# Patient Record
Sex: Female | Born: 1980 | Race: Black or African American | Hispanic: No | Marital: Married | Smoking: Never smoker
Health system: Southern US, Community
[De-identification: ages and names within clinical notes are randomized; demographics above are authoritative.]

## PROBLEM LIST (undated history)

## (undated) DIAGNOSIS — F909 Attention-deficit hyperactivity disorder, unspecified type: Secondary | ICD-10-CM

## (undated) DIAGNOSIS — Z9289 Personal history of other medical treatment: Secondary | ICD-10-CM

## (undated) DIAGNOSIS — F419 Anxiety disorder, unspecified: Secondary | ICD-10-CM

## (undated) DIAGNOSIS — N809 Endometriosis, unspecified: Secondary | ICD-10-CM

## (undated) DIAGNOSIS — D649 Anemia, unspecified: Secondary | ICD-10-CM

## (undated) DIAGNOSIS — K219 Gastro-esophageal reflux disease without esophagitis: Secondary | ICD-10-CM

## (undated) DIAGNOSIS — J45909 Unspecified asthma, uncomplicated: Secondary | ICD-10-CM

## (undated) HISTORY — DX: Attention-deficit hyperactivity disorder, unspecified type: F90.9

## (undated) HISTORY — PX: DILATION AND CURETTAGE OF UTERUS: SHX78

## (undated) HISTORY — PX: NASAL SINUS SURGERY: SHX719

## (undated) HISTORY — PX: DIAGNOSTIC LAPAROSCOPY: SUR761

## (undated) HISTORY — PX: TUBAL LIGATION: SHX77

## (undated) HISTORY — PX: OTHER SURGICAL HISTORY: SHX169

## (undated) HISTORY — DX: Anxiety disorder, unspecified: F41.9

---
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
## 1999-01-19 ENCOUNTER — Other Ambulatory Visit: Admission: RE | Admit: 1999-01-19 | Discharge: 1999-01-19 | Payer: Self-pay | Admitting: *Deleted

## 1999-04-25 ENCOUNTER — Inpatient Hospital Stay (HOSPITAL_COMMUNITY): Admission: AD | Admit: 1999-04-25 | Discharge: 1999-04-25 | Payer: Self-pay | Admitting: Obstetrics and Gynecology

## 1999-12-21 ENCOUNTER — Other Ambulatory Visit: Admission: RE | Admit: 1999-12-21 | Discharge: 1999-12-21 | Payer: Self-pay | Admitting: Internal Medicine

## 2001-04-12 ENCOUNTER — Emergency Department (HOSPITAL_COMMUNITY): Admission: EM | Admit: 2001-04-12 | Discharge: 2001-04-12 | Payer: Self-pay | Admitting: Emergency Medicine

## 2001-09-20 ENCOUNTER — Inpatient Hospital Stay (HOSPITAL_COMMUNITY): Admission: AD | Admit: 2001-09-20 | Discharge: 2001-09-20 | Payer: Self-pay | Admitting: Obstetrics

## 2001-11-23 ENCOUNTER — Other Ambulatory Visit: Admission: RE | Admit: 2001-11-23 | Discharge: 2001-11-23 | Payer: Self-pay | Admitting: Obstetrics and Gynecology

## 2004-12-06 DIAGNOSIS — Z9289 Personal history of other medical treatment: Secondary | ICD-10-CM

## 2004-12-06 HISTORY — DX: Personal history of other medical treatment: Z92.89

## 2005-06-07 ENCOUNTER — Encounter (INDEPENDENT_AMBULATORY_CARE_PROVIDER_SITE_OTHER): Payer: Self-pay | Admitting: *Deleted

## 2005-06-07 ENCOUNTER — Observation Stay (HOSPITAL_COMMUNITY): Admission: AD | Admit: 2005-06-07 | Discharge: 2005-06-08 | Payer: Self-pay | Admitting: Gynecology

## 2005-06-07 ENCOUNTER — Encounter: Payer: Self-pay | Admitting: Emergency Medicine

## 2009-01-02 ENCOUNTER — Emergency Department (HOSPITAL_COMMUNITY): Admission: EM | Admit: 2009-01-02 | Discharge: 2009-01-03 | Payer: Self-pay | Admitting: Emergency Medicine

## 2010-05-15 ENCOUNTER — Ambulatory Visit (HOSPITAL_COMMUNITY): Admission: RE | Admit: 2010-05-15 | Discharge: 2010-05-15 | Payer: Self-pay | Admitting: Obstetrics

## 2011-02-22 LAB — CBC
Hemoglobin: 13.2 g/dL (ref 12.0–15.0)
MCHC: 34.4 g/dL (ref 30.0–36.0)
MCV: 93.3 fL (ref 78.0–100.0)
RBC: 4.11 MIL/uL (ref 3.87–5.11)

## 2011-02-22 LAB — PREGNANCY, URINE: Preg Test, Ur: NEGATIVE

## 2011-03-22 LAB — DIFFERENTIAL
Basophils Absolute: 0.1 10*3/uL (ref 0.0–0.1)
Basophils Relative: 1 % (ref 0–1)
Monocytes Absolute: 0.6 10*3/uL (ref 0.1–1.0)
Neutro Abs: 2.4 10*3/uL (ref 1.7–7.7)
Neutrophils Relative %: 40 % — ABNORMAL LOW (ref 43–77)

## 2011-03-22 LAB — URINALYSIS, ROUTINE W REFLEX MICROSCOPIC
Bilirubin Urine: NEGATIVE
Ketones, ur: NEGATIVE mg/dL
Nitrite: NEGATIVE
Protein, ur: NEGATIVE mg/dL
Urobilinogen, UA: 0.2 mg/dL (ref 0.0–1.0)

## 2011-03-22 LAB — CBC
Hemoglobin: 12.5 g/dL (ref 12.0–15.0)
MCHC: 33.2 g/dL (ref 30.0–36.0)
Platelets: 323 10*3/uL (ref 150–400)
RDW: 12.9 % (ref 11.5–15.5)

## 2011-03-22 LAB — POCT I-STAT, CHEM 8
Calcium, Ion: 1.2 mmol/L (ref 1.12–1.32)
Chloride: 105 mEq/L (ref 96–112)
HCT: 42 % (ref 36.0–46.0)
Sodium: 141 mEq/L (ref 135–145)
TCO2: 25 mmol/L (ref 0–100)

## 2011-03-22 LAB — WET PREP, GENITAL
Trich, Wet Prep: NONE SEEN
Yeast Wet Prep HPF POC: NONE SEEN

## 2011-03-22 LAB — URINE MICROSCOPIC-ADD ON

## 2011-03-22 LAB — RPR: RPR Ser Ql: NONREACTIVE

## 2011-04-23 NOTE — Discharge Summary (Signed)
NAME:  Andrea Waters, Andrea Waters NO.:  [id number]   MEDICAL RECORD NO.:  [id number]          PATIENT TYPE:  INP   LOCATION:  9319                          FACILITY:  [facility]   PHYSICIAN:  Ginger Carne, MD  DATE OF BIRTH:  October 21, 1981   DATE OF ADMISSION:  06/07/2005  DATE OF DISCHARGE:  06/08/2005                                 DISCHARGE SUMMARY   REASON FOR HOSPITALIZATION:  Right ectopic pregnancy, 5-[redacted] week gestation.   IN HOSPITAL PROCEDURES:  Laparoscopic right salpingectomy.   FINAL DIAGNOSIS:  Right ectopic tubal pregnancy.   HOSPITAL COURSE:  This patient is a 30 year old African-American female who  presented to the [facility] emergency room, in the early morning of June 07, 2005, complaining of right lower quadrant pain. Her last menstrual period  was Apr 21, 2005 and a transvaginal ultrasound revealed evidence of right  ectopic pregnancy. She has had 2 weeks of right lower quadrant pain  preceding said admission. Her hemoglobin at the time of being seen in the  emergency room was 8.7 which went to 7.9 on the morning of said admission to  the [facility] [place].   The patient was taken to the operating room in the late afternoon of June 07, 2005 and received 2 units of packed red blood cells at the same time. The  patient underwent a laparoscopic right salpingectomy. The tube was  completely involved with said ectopic pregnancy and could not be safely  preserved. Her postoperative course was uneventful.  She was afebrile. Her postoperative hemoglobin was 10.5, and hematocrit  33.2. She postoperatively had a soft abdomen, calves were without  tenderness. An intrauterine device was seen inutero at the time of her  preoperative ultrasound.   The patient was given routine postoperative instructions, advised to call  the office for temperature elevation of 100.4 degrees Fahrenheit, increasing  abdominal pain, and difficulty voiding. The patient is in  [employer] and  will be leaving for [place] in approximately 2 to 3 weeks and [place] care.   Vicodin 1-2 q.4-6h p.o. p.r.n. pain was prescribed to the patient. She was  kept overnight post-operatively because of significant nausea and vomiting  and difficulty in maintaining solids and liquids. She did respond to Zofran  and was stable on discharge on June 08, 2005 in the early a.m. The patient is  O positive.       SHB/MEDQ  D:  06/08/2005  T:  06/08/2005  Job:  [id number]

## 2011-04-23 NOTE — Op Note (Signed)
NAME:  [name], [name] NO.:  [id number]   MEDICAL RECORD NO.:  [id number]          PATIENT TYPE:  INP   LOCATION:  9319                          [facility]ILITY[facility]  [facility]   PHYSICIAN:  Ginger Carne, MD  DATE OF BIRTH:  21-Mar-1981   DATE OF PROCEDURE:  06/07/2005  DATE OF DISCHARGE:                                 OPERATIVE REPORT   PREOPERATIVE DIAGNOSIS:  Right ectopic pregnancy.   POSTOPERATIVE DIAGNOSIS:  Right ectopic pregnancy.   PROCEDURE:  Laparoscopic right salpingectomy.   SURGEON:  Ginger Carne, M.D.   ASSISTANT:  None.   COMPLICATIONS:  None immediate.   ESTIMATED BLOOD LOSS:  Minimal.   ANESTHESIA:  General.   SPECIMENS:  Right tube.   FINDINGS:  External genitalia, vulva, and vagina normal.  Cervix smooth  without erosions or lesions.  Laparoscopic evaluation revealed evidence of  approximately 50 mL of free blood in the cul-de-sac.  The right tube had a  large ectopic pregnancy starting from the isthmus to the fimbriated end with  leakage.  The right ovary was normal.  Left tube and ovary appeared normal.  No evidence of endometriosis or other pathology noted.  Upper abdomen  appeared normal.   DESCRIPTION OF PROCEDURE:  The patient was prepped and draped in the usual  sterile fashion and placed in the lithotomy position.  Betadine solution  used for antiseptic and the patient was catheterized prior to the procedure.  After adequate general anesthesia a tenaculum was placed on the anterior lip  of the cervix and the pilosa uterine manipulator placed inside the  endocervical canal.  Following this a vertical infraumbilical incision was  made and the Veress needle placed in the abdomen.  Opening and closing  pressures were 10 to 15 mmHg.  Needle released. Trocar placed in the same  incision.  Laparoscope placed through the trocar sleeve.  Afterward a 5 mm  port was made under direct visualization in the left lower quadrant in left  hypogastric region.  Inspection of the pelvic contents was carried out.  Afterward using bipolar cautery, the mesosalpinx on the right side was  bipolar cauterized to the isthmus.  The tube was then removed.  The isthmus  was further cauterized on the proximal end.  The tube was removed for  pathology with an Endobag.  Copious irrigation with Ringer's lactate  followed in the operative field  without evidence of active bleeding.  Irrigant removed.  Gas released and  trocars removed.  Closure of 10 mm fascia site with 0 Vicryl suture and 4-0  Vicryl for subcutaneous closure.  Needle, sponge, and instrument counts  correct.  The patient tolerated the procedure well and returned to the  postanesthesia recovery room in excellent condition.       SHB/MEDQ  D:  06/07/2005  T:  06/07/2005  Job:  [id number]

## 2013-02-20 ENCOUNTER — Encounter: Payer: Self-pay | Admitting: Obstetrics

## 2013-02-20 ENCOUNTER — Other Ambulatory Visit: Payer: Self-pay | Admitting: Obstetrics

## 2013-02-20 NOTE — H&P (Signed)
Andrea Waters is an 32 y.o. female. C/O of recurrent heavy periods and pain after having endometrial ablation done in 2011.  Desires repeat of procedure.  Pertinent Gynecological History: Menses: flow is moderate and with severe dysmenorrhea Bleeding: dysfunctional uterine bleeding Contraception: tubal ligation DES exposure: denies Blood transfusions: none Sexually transmitted diseases: no past history Previous GYN Procedures: IUD Insertion and Removal, Salpingectomy and BTL.  Last mammogram: n/a Date: n/a Last pap: normal Date: 2013 OB History: G5, P3   Menstrual History: Menarche age: unknown  No LMP recorded.    No past medical history on file.  No past surgical history on file.  No family history on file.  Social History:  has no tobacco, alcohol, and drug history on file.  Allergies: Not on File   (Not in a hospital admission)  Review of Systems  All other systems reviewed and are negative.    There were no vitals taken for this visit. Physical Exam  Nursing note and vitals reviewed. Constitutional: She is oriented to person, place, and time. She appears well-developed and well-nourished.  HENT:  Head: Normocephalic and atraumatic.  Eyes: Conjunctivae are normal. Pupils are equal, round, and reactive to light.  Neck: Normal range of motion. Neck supple.  Cardiovascular: Normal rate and regular rhythm.   Respiratory: Effort normal and breath sounds normal.  GI: Soft. Bowel sounds are normal.  Genitourinary: Vagina normal and uterus normal.  Musculoskeletal: Normal range of motion.  Neurological: She is alert and oriented to person, place, and time.  Skin: Skin is warm and dry.  Psychiatric: She has a normal mood and affect. Her behavior is normal. Judgment and thought content normal.    No results found for this or any previous visit (from the past 24 hour(s)).  No results found.  Assessment/Plan: Menorrhagia / Dysmenorrhea.  Hysteroscopy, D&C  and Endometrial Ablation planned.  Alexsys Eskin A 02/20/2013, 5:38 PM

## 2013-02-21 ENCOUNTER — Encounter (HOSPITAL_COMMUNITY): Payer: Self-pay | Admitting: Pharmacist

## 2013-02-21 ENCOUNTER — Encounter (HOSPITAL_COMMUNITY): Payer: Self-pay | Admitting: Obstetrics

## 2013-03-01 ENCOUNTER — Encounter (HOSPITAL_COMMUNITY): Payer: Self-pay | Admitting: Anesthesiology

## 2013-03-01 NOTE — Anesthesia Preprocedure Evaluation (Addendum)
Anesthesia Evaluation  Patient identified by MRN, date of birth, ID band Patient awake    Reviewed: Allergy & Precautions, H&P , NPO status , Patient's Chart, lab work & pertinent test results  History of Anesthesia Complications (+) PONV  Airway Mallampati: II TM Distance: >3 FB Neck ROM: Full    Dental no notable dental hx. (+) Teeth Intact   Pulmonary neg pulmonary ROS,  breath sounds clear to auscultation  Pulmonary exam normal       Cardiovascular negative cardio ROS  Rhythm:Regular Rate:Normal     Neuro/Psych negative neurological ROS  negative psych ROS   GI/Hepatic negative GI ROS, Neg liver ROS,   Endo/Other  negative endocrine ROS  Renal/GU negative Renal ROS  negative genitourinary   Musculoskeletal   Abdominal   Peds  Hematology negative hematology ROS (+)   Anesthesia Other Findings   Reproductive/Obstetrics Menorrhagia                          Anesthesia Physical Anesthesia Plan  ASA: II  Anesthesia Plan: General   Post-op Pain Management:    Induction:   Airway Management Planned: LMA  Additional Equipment:   Intra-op Plan:   Post-operative Plan: Extubation in OR  Informed Consent: I have reviewed the patients History and Physical, chart, labs and discussed the procedure including the risks, benefits and alternatives for the proposed anesthesia with the patient or authorized representative who has indicated his/her understanding and acceptance.   Dental advisory given  Plan Discussed with: CRNA, Anesthesiologist and Surgeon  Anesthesia Plan Comments:         Anesthesia Quick Evaluation

## 2013-03-02 ENCOUNTER — Encounter (HOSPITAL_COMMUNITY)
Admission: RE | Disposition: A | Discharge status: Home / Self Care | Payer: Self-pay | Source: Ambulatory Visit | Attending: Obstetrics

## 2013-03-02 ENCOUNTER — Ambulatory Visit (HOSPITAL_COMMUNITY)
Admission: RE | Admit: 2013-03-02 | Discharge: 2013-03-02 | Disposition: A | Discharge status: Home / Self Care | Source: Ambulatory Visit | Attending: Obstetrics | Admitting: Obstetrics

## 2013-03-02 ENCOUNTER — Encounter (HOSPITAL_COMMUNITY): Payer: Self-pay | Admitting: Anesthesiology

## 2013-03-02 ENCOUNTER — Encounter (HOSPITAL_COMMUNITY): Payer: Self-pay | Admitting: *Deleted

## 2013-03-02 ENCOUNTER — Ambulatory Visit (HOSPITAL_COMMUNITY): Admitting: Anesthesiology

## 2013-03-02 DIAGNOSIS — N920 Excessive and frequent menstruation with regular cycle: Secondary | ICD-10-CM

## 2013-03-02 DIAGNOSIS — N946 Dysmenorrhea, unspecified: Secondary | ICD-10-CM | POA: Insufficient documentation

## 2013-03-02 DIAGNOSIS — N949 Unspecified condition associated with female genital organs and menstrual cycle: Secondary | ICD-10-CM | POA: Insufficient documentation

## 2013-03-02 DIAGNOSIS — N938 Other specified abnormal uterine and vaginal bleeding: Secondary | ICD-10-CM | POA: Insufficient documentation

## 2013-03-02 HISTORY — DX: Personal history of other medical treatment: Z92.89

## 2013-03-02 HISTORY — PX: HYSTEROSCOPY: SHX211

## 2013-03-02 LAB — CBC
Hemoglobin: 12.2 g/dL (ref 12.0–15.0)
MCH: 31.2 pg (ref 26.0–34.0)
MCHC: 33.5 g/dL (ref 30.0–36.0)
Platelets: 279 10*3/uL (ref 150–400)
RDW: 12.2 % (ref 11.5–15.5)

## 2013-03-02 SURGERY — HYSTEROSCOPY
Anesthesia: General | Site: Vagina | Wound class: Clean Contaminated

## 2013-03-02 MED ORDER — FENTANYL CITRATE 0.05 MG/ML IJ SOLN
INTRAMUSCULAR | Status: AC
  Administered 2013-03-02: 50 ug via INTRAVENOUS
  Filled 2013-03-02: qty 2

## 2013-03-02 MED ORDER — DEXAMETHASONE SODIUM PHOSPHATE 10 MG/ML IJ SOLN
INTRAMUSCULAR | Status: AC
  Filled 2013-03-02: qty 1

## 2013-03-02 MED ORDER — MIDAZOLAM HCL 5 MG/5ML IJ SOLN
INTRAMUSCULAR | Status: DC | PRN
  Administered 2013-03-02 (×2): 1 mg via INTRAVENOUS

## 2013-03-02 MED ORDER — SCOPOLAMINE 1 MG/3DAYS TD PT72
MEDICATED_PATCH | TRANSDERMAL | Status: AC
  Administered 2013-03-02: 1.5 mg via TRANSDERMAL
  Filled 2013-03-02: qty 1

## 2013-03-02 MED ORDER — DEXTROSE 5 % IV SOLN
INTRAVENOUS | Status: DC | PRN
  Administered 2013-03-02: 1000 mL via INTRAVENOUS

## 2013-03-02 MED ORDER — PROPOFOL 10 MG/ML IV EMUL
INTRAVENOUS | Status: DC | PRN
  Administered 2013-03-02: 150 mg via INTRAVENOUS

## 2013-03-02 MED ORDER — MIDAZOLAM HCL 2 MG/2ML IJ SOLN
INTRAMUSCULAR | Status: AC
  Filled 2013-03-02: qty 2

## 2013-03-02 MED ORDER — FENTANYL CITRATE 0.05 MG/ML IJ SOLN
INTRAMUSCULAR | Status: DC | PRN
  Administered 2013-03-02: 50 ug via INTRAVENOUS

## 2013-03-02 MED ORDER — FENTANYL CITRATE 0.05 MG/ML IJ SOLN
25.0000 ug | INTRAMUSCULAR | Status: DC | PRN
  Administered 2013-03-02 (×2): 50 ug via INTRAVENOUS

## 2013-03-02 MED ORDER — ONDANSETRON HCL 4 MG/2ML IJ SOLN
INTRAMUSCULAR | Status: DC | PRN
  Administered 2013-03-02: 4 mg via INTRAVENOUS

## 2013-03-02 MED ORDER — PROPOFOL 10 MG/ML IV EMUL
INTRAVENOUS | Status: AC
  Filled 2013-03-02: qty 20

## 2013-03-02 MED ORDER — GLYCOPYRROLATE 0.2 MG/ML IJ SOLN
INTRAMUSCULAR | Status: AC
  Filled 2013-03-02: qty 2

## 2013-03-02 MED ORDER — LACTATED RINGERS IV SOLN
INTRAVENOUS | Status: DC
  Administered 2013-03-02 (×2): via INTRAVENOUS

## 2013-03-02 MED ORDER — DEXAMETHASONE SODIUM PHOSPHATE 4 MG/ML IJ SOLN
INTRAMUSCULAR | Status: DC | PRN
  Administered 2013-03-02: 4 mg via INTRAVENOUS

## 2013-03-02 MED ORDER — OXYCODONE-ACETAMINOPHEN 10-325 MG PO TABS: 1.0000 | ORAL_TABLET | Freq: Four times a day (QID) | ORAL | Status: DC | PRN

## 2013-03-02 MED ORDER — SCOPOLAMINE 1 MG/3DAYS TD PT72: 1.0000 | MEDICATED_PATCH | TRANSDERMAL | Status: DC

## 2013-03-02 MED ORDER — GLYCOPYRROLATE 0.2 MG/ML IJ SOLN
INTRAMUSCULAR | Status: AC
  Filled 2013-03-02: qty 1

## 2013-03-02 MED ORDER — KETOROLAC TROMETHAMINE 30 MG/ML IJ SOLN
INTRAMUSCULAR | Status: AC
  Filled 2013-03-02: qty 1

## 2013-03-02 MED ORDER — KETOROLAC TROMETHAMINE 30 MG/ML IJ SOLN
INTRAMUSCULAR | Status: DC | PRN
  Administered 2013-03-02: 30 mg via INTRAMUSCULAR

## 2013-03-02 MED ORDER — LIDOCAINE HCL 1 % IJ SOLN
INTRAMUSCULAR | Status: DC | PRN
  Administered 2013-03-02: 20 mL

## 2013-03-02 MED ORDER — METOCLOPRAMIDE HCL 5 MG/ML IJ SOLN: 10.0000 mg | Freq: Once | INTRAMUSCULAR | Status: DC | PRN

## 2013-03-02 MED ORDER — FENTANYL CITRATE 0.05 MG/ML IJ SOLN
INTRAMUSCULAR | Status: AC
  Filled 2013-03-02: qty 4

## 2013-03-02 MED ORDER — LIDOCAINE HCL (CARDIAC) 20 MG/ML IV SOLN
INTRAVENOUS | Status: DC | PRN
  Administered 2013-03-02: 60 mg via INTRAVENOUS

## 2013-03-02 MED ORDER — ONDANSETRON HCL 4 MG/2ML IJ SOLN
INTRAMUSCULAR | Status: AC
  Filled 2013-03-02: qty 2

## 2013-03-02 MED ORDER — LIDOCAINE HCL (CARDIAC) 20 MG/ML IV SOLN
INTRAVENOUS | Status: AC
  Filled 2013-03-02: qty 5

## 2013-03-02 MED ORDER — NEOSTIGMINE METHYLSULFATE 1 MG/ML IJ SOLN
INTRAMUSCULAR | Status: AC
  Filled 2013-03-02: qty 1

## 2013-03-02 MED ORDER — MEPERIDINE HCL 25 MG/ML IJ SOLN: 6.2500 mg | INTRAMUSCULAR | Status: DC | PRN

## 2013-03-02 SURGICAL SUPPLY — 14 items
CANISTER SUCTION 2500CC (MISCELLANEOUS) ×3 IMPLANT
CATH ROBINSON RED A/P 16FR (CATHETERS) ×3 IMPLANT
CATH THERMACHOICE III (CATHETERS) ×3 IMPLANT
CLOTH BEACON ORANGE TIMEOUT ST (SAFETY) ×3 IMPLANT
CONTAINER PREFILL 10% NBF 60ML (FORM) IMPLANT
DILATOR CANAL MILEX (MISCELLANEOUS) ×3 IMPLANT
DRESSING TELFA 8X3 (GAUZE/BANDAGES/DRESSINGS) ×3 IMPLANT
GLOVE BIO SURGEON STRL SZ7.5 (GLOVE) ×3 IMPLANT
GOWN PREVENTION PLUS XLARGE (GOWN DISPOSABLE) ×3 IMPLANT
GOWN STRL REIN XL XLG (GOWN DISPOSABLE) ×6 IMPLANT
PACK HYSTEROSCOPY LF (CUSTOM PROCEDURE TRAY) ×3 IMPLANT
PAD OB MATERNITY 4.3X12.25 (PERSONAL CARE ITEMS) ×3 IMPLANT
TOWEL OR 17X24 6PK STRL BLUE (TOWEL DISPOSABLE) ×6 IMPLANT
WATER STERILE IRR 1000ML POUR (IV SOLUTION) ×3 IMPLANT

## 2013-03-02 NOTE — Preoperative (Signed)
Beta Blockers   Reason not to administer Beta Blockers:Not Applicable 

## 2013-03-02 NOTE — Op Note (Signed)
Preoperative diagnosis: dysfunctional uterine bleeding and dysmenorrhagia  Postoperative diagnosis: Same, endometrial cavity adhesions  Procedure: Diagnostic hysteroscopy  Surgeon: Coral Ceo  Anesthesia:general  Estimated blood loss: Negligible  Urine output: Not measured  IV Fluids: per anesthesia  Complications: None  Specimen: none  Operative Findings: Multiple endometrial cavity adhesions and narrowing at internal os impeding entrance into the cavity of the uterus.   Description of procedure:   The patient was taken to the operating room and placed on the operating table in the semi-lithotomy position in [place] stirrups.  Examination under anesthesia was performed.  The patient was prepped and draped in the usual manner.  After a time-out had been completed, a speculum was placed in the vagina.  The anterior lip of the cervix was grasped with a single-toothed tenaculum.    20 cc of 1% lidocaine were injected at 4 and 7 o'clock to produce a paracervical block.  The uterine cavity sounded to 5 cm.  The endometrial cavity was narrow and scarred and could not get access to fundus.  A 5 mm diagnostic hysteroscope with Glycine as the distending medium was used to perform a diagnostic hysteroscopy.  The findings are noted above.    The hysteroscope was removed.   All the instruments were removed from the vagina.  Final instrument counts were correct.  The patient was taken to the PACU in stable condition.

## 2013-03-02 NOTE — Anesthesia Postprocedure Evaluation (Signed)
  Anesthesia Post-op Note  Anesthesia Post Note  Patient: Andrea Waters  Procedure(s) Performed: Procedure(s) (LRB): HYSTEROSCOPY (N/A)  Anesthesia type: General  Patient location: PACU  Post pain: Pain level controlled  Post assessment: Post-op Vital signs reviewed  Last Vitals:  Filed Vitals:   03/02/13 1001  BP:   Pulse: 76  Temp: 36.9 C  Resp: 17    Post vital signs: Reviewed  Level of consciousness: sedated  Complications: No apparent anesthesia complications

## 2013-03-02 NOTE — H&P (Signed)
There has been no change in patient's status sine H&P done.

## 2013-03-02 NOTE — Transfer of Care (Signed)
Immediate Anesthesia Transfer of Care Note  Patient: Andrea Waters  Procedure(s) Performed: Procedure(s): DILATATION & CURETTAGE/HYSTEROSCOPY WITH THERMACHOICE ABLATION (N/A)  Patient Location: PACU  Anesthesia Type:General  Level of Consciousness: awake, alert , oriented and patient cooperative  Airway & Oxygen Therapy: Patient Spontanous Breathing and Patient connected to nasal cannula oxygen  Post-op Assessment: Report given to PACU RN and Post -op Vital signs reviewed and stable  Post vital signs: Reviewed and stable  Complications: No apparent anesthesia complications

## 2013-03-05 ENCOUNTER — Encounter (HOSPITAL_COMMUNITY): Payer: Self-pay | Admitting: Obstetrics

## 2013-03-22 ENCOUNTER — Encounter: Payer: Self-pay | Admitting: Obstetrics

## 2013-05-02 ENCOUNTER — Ambulatory Visit (INDEPENDENT_AMBULATORY_CARE_PROVIDER_SITE_OTHER): Admitting: Obstetrics

## 2013-05-02 ENCOUNTER — Encounter: Payer: Self-pay | Admitting: Obstetrics

## 2013-05-02 VITALS — BP 120/82 | HR 72 | Temp 98.5°F | Ht 63.0 in | Wt 138.8 lb

## 2013-05-02 DIAGNOSIS — N920 Excessive and frequent menstruation with regular cycle: Secondary | ICD-10-CM

## 2013-05-02 DIAGNOSIS — N946 Dysmenorrhea, unspecified: Secondary | ICD-10-CM

## 2013-05-02 NOTE — Progress Notes (Signed)
.   Subjective:     Andrea Waters is a 32 y.o. female who presents to the clinic 8 weeks status post diagnostic hysteroscopy for abnormal uterine bleeding and pelvic pain. Eating a regular diet without difficulty. Bowel movements are normal. The patient is not having any pain.  The following portions of the patient's history were reviewed and updated as appropriate: allergies, current medications, past family history, past medical history, past social history, past surgical history and problem list.  Review of Systems Pertinent items are noted in HPI.    Objective:    BP 120/82  Pulse 72  Temp(Src) 98.5 F (36.9 C) (Oral)  Ht 5\' 3"  (1.6 m)  Wt 138 lb 12.8 oz (62.959 kg)  BMI 24.59 kg/m2 General:  alert and no distress  Abdomen: soft, non-tender  Incision:    None     Assessment:    Doing well postoperatively. Operative findings again reviewed. Pathology report discussed.   Patient considering further surgical options, i.e. Hysterectomy. Plan:    1. Continue any current medications. 2. Wound care discussed. 3. Activity restrictions: none 4. Anticipated return to work: now. 5. Follow up: prn

## 2013-05-03 ENCOUNTER — Encounter: Payer: Self-pay | Admitting: Obstetrics

## 2013-05-03 NOTE — Patient Instructions (Signed)
Management of DUB and Dysmenorrhea.

## 2013-05-08 ENCOUNTER — Other Ambulatory Visit: Payer: Self-pay | Admitting: Obstetrics

## 2013-05-08 ENCOUNTER — Ambulatory Visit (INDEPENDENT_AMBULATORY_CARE_PROVIDER_SITE_OTHER): Admitting: Obstetrics

## 2013-05-08 ENCOUNTER — Ambulatory Visit (INDEPENDENT_AMBULATORY_CARE_PROVIDER_SITE_OTHER)

## 2013-05-08 ENCOUNTER — Encounter: Payer: Self-pay | Admitting: Obstetrics

## 2013-05-08 VITALS — BP 116/85 | HR 69 | Temp 97.9°F | Ht 63.0 in | Wt 139.0 lb

## 2013-05-08 DIAGNOSIS — N946 Dysmenorrhea, unspecified: Secondary | ICD-10-CM

## 2013-05-08 DIAGNOSIS — N949 Unspecified condition associated with female genital organs and menstrual cycle: Secondary | ICD-10-CM

## 2013-05-08 DIAGNOSIS — N809 Endometriosis, unspecified: Secondary | ICD-10-CM

## 2013-05-08 DIAGNOSIS — N920 Excessive and frequent menstruation with regular cycle: Secondary | ICD-10-CM | POA: Insufficient documentation

## 2013-05-08 MED ORDER — OXYCODONE-ACETAMINOPHEN 10-325 MG PO TABS: 1.0000 | ORAL_TABLET | ORAL | Status: DC | PRN

## 2013-05-08 NOTE — Progress Notes (Signed)
Subjective:    Andrea Waters is a 32 y.o. female who presents to the clinic s/p endometrial ablation for menorrhagia and dysmenorrhea.  Has had no bleeding since procedure but still has pain.  Eating a regular diet without difficulty. Bowel movements are normal. The patient has H/O endometriosis documented on laparoscopy when she had a tubal sterilization.  She presents for consultation for hysterectomy for the chronic pain because she is tired of taking pain meds, Lupron, etc chronically.    Gynecologic History Contraception: tubal ligation     The following portions of the patient's history were reviewed and updated as appropriate: allergies, current medications, past family history, past medical history, past social history, past surgical history and problem list.  Review of Systems Pertinent items are noted in HPI.    Objective:    General appearance: alert and no distress    Abdomen:  Soft, NT  Pelvic:  NEFG.               Vulva, vagina and cervix normal.               Uterus NSSC, NT.               Adnexa negative Assessment:   Chronic pelvic pain.  H/O endometriosis.  Pelvic U/S done in office and WNL's  Patient wants hysterectomy.   Plan:    Education reviewed: Management of chronic pelvic pain.Marland Kitchen    Referred for consultation with Dr. Tina Griffiths for possible Robotic Assisted TLH.

## 2013-05-09 ENCOUNTER — Encounter: Payer: Self-pay | Admitting: Obstetrics

## 2013-05-15 ENCOUNTER — Encounter: Payer: Self-pay | Admitting: Obstetrics

## 2013-05-28 ENCOUNTER — Encounter: Payer: Self-pay | Admitting: Obstetrics

## 2013-05-28 ENCOUNTER — Ambulatory Visit (INDEPENDENT_AMBULATORY_CARE_PROVIDER_SITE_OTHER): Admitting: Obstetrics

## 2013-05-28 ENCOUNTER — Encounter: Payer: Self-pay | Admitting: Obstetrics & Gynecology

## 2013-05-28 ENCOUNTER — Ambulatory Visit (INDEPENDENT_AMBULATORY_CARE_PROVIDER_SITE_OTHER): Admitting: Obstetrics & Gynecology

## 2013-05-28 VITALS — BP 138/84 | HR 71 | Temp 98.5°F | Ht 63.0 in | Wt 138.8 lb

## 2013-05-28 DIAGNOSIS — N809 Endometriosis, unspecified: Secondary | ICD-10-CM

## 2013-05-28 MED ORDER — OXYCODONE-ACETAMINOPHEN 10-325 MG PO TABS: 1.0000 | ORAL_TABLET | Freq: Four times a day (QID) | ORAL | Status: DC | PRN

## 2013-05-28 MED ORDER — NORETHINDRONE ACETATE 5 MG PO TABS: 5.0000 mg | ORAL_TABLET | Freq: Two times a day (BID) | ORAL | Status: DC

## 2013-05-28 NOTE — Patient Instructions (Addendum)
Hysterectomy Information  A hysterectomy is a procedure where your uterus is surgically removed. It will no longer be possible to have menstrual periods or to become pregnant. The tubes and ovaries can be removed (bilateral salpingo-oopherectomy) during this surgery as well.  REASONS FOR A HYSTERECTOMY  Persistent, abnormal bleeding.  Lasting (chronic) pelvic pain or infection.  The lining of the uterus (endometrium) starts growing outside the uterus (endometriosis).  The endometrium starts growing in the muscle of the uterus (adenomyosis).  The uterus falls down into the vagina (pelvic organ prolapse).  Symptomatic uterine fibroids.  Precancerous cells.  Cervical cancer or uterine cancer. TYPES OF HYSTERECTOMIES  Supracervical hysterectomy. This type removes the top part of the uterus, but not the cervix.  Total hysterectomy. This type removes the uterus and cervix.  Radical hysterectomy. This type removes the uterus, cervix, and the fibrous tissue that holds the uterus in place in the pelvis (parametrium). WAYS A HYSTERECTOMY CAN BE PERFORMED  Abdominal hysterectomy. A large surgical cut (incision) is made in the abdomen. The uterus is removed through this incision.  Vaginal hysterectomy. An incision is made in the vagina. The uterus is removed through this incision. There are no abdominal incisions.  Conventional laparoscopic hysterectomy. A thin, lighted tube with a camera (laparoscope) is inserted into 3 or 4 small incisions in the abdomen. The uterus is cut into small pieces. The small pieces are removed through the incisions, or they are removed through the vagina.  Laparoscopic assisted vaginal hysterectomy (LAVH). Three or four small incisions are made in the abdomen. Part of the surgery is performed laparoscopically and part vaginally. The uterus is removed through the vagina.  Robot-assisted laparoscopic hysterectomy. A laparoscope is inserted into 3 or 4 small  incisions in the abdomen. A computer-controlled device is used to give the surgeon a 3D image. This allows for more precise movements of surgical instruments. The uterus is cut into small pieces and removed through the incisions or removed through the vagina. RISKS OF HYSTERECTOMY   Bleeding and risk of blood transfusion. Tell your caregiver if you do not want to receive any blood products.  Blood clots in the legs or lung.  Infection.  Injury to surrounding organs.  Anesthesia problems or side effects.  Conversion to an abdominal hysterectomy. WHAT TO EXPECT AFTER A HYSTERECTOMY  You will be given pain medicine.  You will need to have someone with you for the first 3 to 5 days after you go home.  You will need to follow up with your surgeon in 2 to 4 weeks after surgery to evaluate your progress.  You may have early menopause symptoms like hot flashes, night sweats, and insomnia.  If you had a hysterectomy for a problem that was not a cancer or a condition that could lead to cancer, then you no longer need Pap tests. However, even if you no longer need a Pap test, a regular exam is a good idea to make sure no other problems are starting. Document Released: 05/18/2001 Document Revised: 02/14/2012 Document Reviewed: 07/03/2011 Exit[employer] Patient Information 2014 [employer].  

## 2013-05-28 NOTE — Progress Notes (Signed)
.   Subjective:     Andrea Waters is a 32 y.o. female here for a surgical consult for a hysterectomy (Endometrosis).  She had an ultrasound done on 05/08/13.  No current complaints.  Personal health questionnaire reviewed: yes.   Gynecologic History No LMP recorded. Patient has had an ablation. Contraception: none Last Pap: . Results were:  Last mammogram: N/A  Obstetric History OB History   Grav Para Term Preterm Abortions TAB SAB Ect Mult Living                   The following portions of the patient's history were reviewed and updated as appropriate: allergies, current medications, past family history, past medical history, past social history, past surgical history and problem list.  Review of Systems Pertinent items are noted in HPI.    Objective:    No exam performed today, patient left without being seen.    Assessment:    Cancelled Appt.   Plan:    Patient rescheduled.

## 2013-05-28 NOTE — Progress Notes (Signed)
.   Subjective:     Andrea Waters is a 32 y.o. female here for a surgical consult for a hysterectomy (endometerosis).  She had an ultrasound done on 05/08/2013.  No current complaints.  Personal health questionnaire reviewed: no.   Gynecologic History No LMP recorded. Patient has had an ablation. Contraception: none Last Pap: 10/2012. Results were: normal Last mammogram: N/A  Obstetric History OB History   Grav Para Term Preterm Abortions TAB SAB Ect Mult Living                   The following portions of the patient's history were reviewed and updated as appropriate: allergies, current medications, past family history, past medical history, past social history, past surgical history and problem list.  Review of Systems Pertinent items are noted in HPI.    Objective:     No exam today     Assessment:   Pelvic pain, h/o endometriosis   Plan:   Attempted TRH/LSO Risks, benefits, treatment alternatives reviewed

## 2013-05-30 ENCOUNTER — Institutional Professional Consult (permissible substitution): Admitting: Obstetrics & Gynecology

## 2013-06-12 ENCOUNTER — Other Ambulatory Visit: Payer: Self-pay | Admitting: *Deleted

## 2013-06-25 ENCOUNTER — Ambulatory Visit (INDEPENDENT_AMBULATORY_CARE_PROVIDER_SITE_OTHER): Admitting: Obstetrics & Gynecology

## 2013-06-25 VITALS — BP 145/93 | HR 73 | Temp 98.1°F | Wt 141.0 lb

## 2013-06-25 DIAGNOSIS — N809 Endometriosis, unspecified: Secondary | ICD-10-CM

## 2013-06-25 MED ORDER — OXYCODONE-ACETAMINOPHEN 10-325 MG PO TABS: 1.0000 | ORAL_TABLET | Freq: Four times a day (QID) | ORAL | Status: DC | PRN

## 2013-06-25 NOTE — Progress Notes (Signed)
Subjective:     Andrea Waters is a 32 y.o. female here for a routine exam.  Current complaints: pelvic pain due to endometrosis. Pt states she is having surgery on July 06, 2013. Pt states she is having increased pain and states she is taking Percocet is not as effective as it has been in the past.    Personal health questionnaire reviewed: yes.   Gynecologic History No LMP recorded. Patient has had an ablation. Contraception: tubal ligation Last Pap: 10/2012. Results were: normal Last mammogram: 2012. Results were: normal  Obstetric History OB History   Grav Para Term Preterm Abortions TAB SAB Ect Mult Living                   The following portions of the patient's history were reviewed and updated as appropriate: allergies, current medications, past family history, past medical history, past social history, past surgical history and problem list.  Review of Systems Pertinent items are noted in HPI.    Objective:   No exam today  Assessment:   Pelvic pain  Plan:  Return postop

## 2013-06-26 ENCOUNTER — Encounter (HOSPITAL_COMMUNITY): Payer: Self-pay | Admitting: Pharmacy Technician

## 2013-06-26 ENCOUNTER — Encounter: Payer: Self-pay | Admitting: Obstetrics & Gynecology

## 2013-06-29 ENCOUNTER — Encounter: Payer: Self-pay | Admitting: Obstetrics & Gynecology

## 2013-07-02 ENCOUNTER — Encounter (HOSPITAL_COMMUNITY)
Admission: RE | Admit: 2013-07-02 | Discharge: 2013-07-02 | Disposition: A | Discharge status: Home / Self Care | Source: Ambulatory Visit | Attending: Obstetrics & Gynecology | Admitting: Obstetrics & Gynecology

## 2013-07-02 ENCOUNTER — Encounter (HOSPITAL_COMMUNITY): Payer: Self-pay

## 2013-07-02 DIAGNOSIS — N926 Irregular menstruation, unspecified: Secondary | ICD-10-CM | POA: Insufficient documentation

## 2013-07-02 DIAGNOSIS — N939 Abnormal uterine and vaginal bleeding, unspecified: Secondary | ICD-10-CM | POA: Insufficient documentation

## 2013-07-02 DIAGNOSIS — Z01812 Encounter for preprocedural laboratory examination: Secondary | ICD-10-CM | POA: Insufficient documentation

## 2013-07-02 HISTORY — DX: Anemia, unspecified: D64.9

## 2013-07-02 HISTORY — DX: Endometriosis, unspecified: N80.9

## 2013-07-02 LAB — CBC
HCT: 41.2 % (ref 36.0–46.0)
Hemoglobin: 13.3 g/dL (ref 12.0–15.0)
MCH: 31 pg (ref 26.0–34.0)
MCHC: 32.3 g/dL (ref 30.0–36.0)
MCV: 96 fL (ref 78.0–100.0)
RDW: 12.4 % (ref 11.5–15.5)

## 2013-07-02 LAB — BASIC METABOLIC PANEL
BUN: 8 mg/dL (ref 6–23)
Calcium: 9.7 mg/dL (ref 8.4–10.5)
Creatinine, Ser: 0.78 mg/dL (ref 0.50–1.10)
GFR calc Af Amer: >90 mL/min (ref >90)
GFR calc non Af Amer: >90 mL/min (ref >90)
Glucose, Bld: 89 mg/dL (ref 70–99)

## 2013-07-02 NOTE — Patient Instructions (Addendum)
YOUR SURGERY IS SCHEDULED AT [facility]  ON:  Friday  8/1  REPORT TO [place] [facility] AT:  8:45 AM      PHONE # FOR SHORT STAY IS [phone]  DO NOT EAT OR DRINK ANYTHING AFTER MIDNIGHT THE NIGHT BEFORE YOUR SURGERY.  YOU MAY BRUSH YOUR TEETH, RINSE OUT YOUR MOUTH--BUT NO WATER, NO FOOD, NO CHEWING GUM, NO MINTS, NO CANDIES, NO CHEWING TOBACCO.  PLEASE TAKE THE FOLLOWING MEDICATIONS THE AM OF YOUR SURGERY WITH A FEW SIPS OF WATER:  OXYCODONE / ACETAMINOPHEN    DO NOT BRING VALUABLES, MONEY, CREDIT CARDS.  DO NOT WEAR JEWELRY, MAKE-UP, NAIL POLISH AND NO METAL PINS OR CLIPS IN YOUR HAIR. CONTACT LENS, DENTURES / PARTIALS, GLASSES SHOULD NOT BE WORN TO SURGERY AND IN MOST CASES-HEARING AIDS WILL NEED TO BE REMOVED.  BRING YOUR GLASSES CASE, ANY EQUIPMENT NEEDED FOR YOUR CONTACT LENS. FOR PATIENTS ADMITTED TO THE HOSPITAL--CHECK OUT TIME THE DAY OF DISCHARGE IS 11:00 AM.  ALL INPATIENT ROOMS ARE PRIVATE - WITH BATHROOM, TELEPHONE, TELEVISION AND WIFI INTERNET.                               FAILURE TO FOLLOW THESE INSTRUCTIONS MAY RESULT IN THE CANCELLATION OF YOUR SURGERY.   PATIENT SIGNATURE_________________________________

## 2013-07-02 NOTE — Pre-Procedure Instructions (Signed)
CXR AND EKG NOT NEEDED PER ANESTHESIOLOGIST'S GUIDELINES.  PT'S B/P ELEVATED TODAY - SHE SAYS SHE IS HAVING PELVIC PAIN- NO HX OF B/P PROBLEMS. DR. Marcia Brash ORDERS SAY  NOT TO DO PREGNANCY TEST - PT HAD UTERINE ABLATION MARCH 2014 - NOT HAVING MENSTRUAL PERIODS AND SHE HAS HAD TUBAL LIGATION.

## 2013-07-03 ENCOUNTER — Other Ambulatory Visit: Payer: Self-pay | Admitting: Obstetrics

## 2013-07-03 ENCOUNTER — Encounter: Payer: Self-pay | Admitting: Obstetrics

## 2013-07-05 NOTE — H&P (Signed)
  Subjective:  Andrea Waters is a 32 y.o.  female.  She has presented with heavy menses and a long history of dysmenorhea.  There is gradual worsening of the clinical course. She recently underwent a D&C for a benign endometrial polyp with an endometrial ablation.  There is a remote history of a laparoscopic salpingectomy for a tubal ectopic pregnancy. The operative findings described specifically no stigmata of endometriosis. An U/S in 6/14 was normal.  Pertinent Gyn History: See above Menses flow is heavy  Contraception: tubal ligation  Last pap: inflammatory changes Date: 1/14  Patient Active Problem List   Diagnosis Date Noted  . Endometriosis, site unspecified 05/08/2013  . Dysmenorrhea 05/08/2013  . Excessive or frequent menstruation 05/08/2013   Past Medical History  Diagnosis Date  . PONV (postoperative nausea and vomiting)   . History of blood transfusion 2006    [facility]  . Anemia   . Endometriosis     CAUSING PELVIC PAIN AND CRAMPING "LIKE CONTRACTIONS" - PREVIOUS TUBAL LIGATION AND ENDOMETRIAL ABLATION ( MARCH 2014) - NOT HAVING MENSTRUAL PERIODS    Past Surgical History  Procedure Laterality Date  . Tubal ligation    . Dilation and curettage of uterus      ablation  . Diagnostic laparoscopy      ectopic, right salpingectomy  . Hysteroscopy N/A 03/02/2013    Procedure: HYSTEROSCOPY;  Surgeon: Brock Bad, MD;  Location: [facility];  Service: Gynecology;  Laterality: N/A;    No prescriptions prior to admission   Allergies  Allergen Reactions  . Kiwi Extract Itching and Swelling    Tongue swells and throat itches     History  Substance Use Topics  . Smoking status: Never Smoker   . Smokeless tobacco: Never Used  . Alcohol Use: Yes     Comment: social    No family history on file.   Review of Systems Pertinent items are noted in HPI.    Objective:    General appearance: alert Breasts: normal appearance, no masses or  tenderness Abdomen: soft, non-tender; bowel sounds normal; no masses,  no organomegaly Pelvic: cervix normal in appearance, external genitalia normal, no adnexal masses or tenderness, uterus normal size, shape, and consistency and vagina normal without discharge       Assessment/Plan: AUB--?etiology--?E,N; h/o polyps; she s/p an endometrial ablaton Dysmenorrhea--? Diagnosis of endometriosis  A TRH and salpingectomy is planned.  I had a lengthy discussion with the patient regarding her bleeding/pain and consideration for hysterectomy.  Procedure, risks, reasons, benefits and complications (including injury to bowel, bladder, major blood vessel, ureter, bleeding, possibility of transfusion, infection, or fistula formation) were reviewed in detail. Consent was signed and preop testing was ordered.  Instructions were reviewed, including NPO after midnight.

## 2013-07-06 ENCOUNTER — Encounter (HOSPITAL_COMMUNITY)
Admission: RE | Disposition: A | Discharge status: Home / Self Care | Payer: Self-pay | Source: Ambulatory Visit | Attending: Obstetrics & Gynecology

## 2013-07-06 ENCOUNTER — Encounter (HOSPITAL_COMMUNITY): Payer: Self-pay | Admitting: *Deleted

## 2013-07-06 ENCOUNTER — Ambulatory Visit (HOSPITAL_COMMUNITY)
Admission: RE | Admit: 2013-07-06 | Discharge: 2013-07-07 | Disposition: A | Discharge status: Home / Self Care | Source: Ambulatory Visit | Attending: Obstetrics & Gynecology | Admitting: Obstetrics & Gynecology

## 2013-07-06 ENCOUNTER — Encounter (HOSPITAL_COMMUNITY): Payer: Self-pay | Admitting: Registered Nurse

## 2013-07-06 ENCOUNTER — Ambulatory Visit (HOSPITAL_COMMUNITY): Admitting: Registered Nurse

## 2013-07-06 DIAGNOSIS — N949 Unspecified condition associated with female genital organs and menstrual cycle: Secondary | ICD-10-CM | POA: Insufficient documentation

## 2013-07-06 DIAGNOSIS — N8300 Follicular cyst of ovary, unspecified side: Secondary | ICD-10-CM | POA: Insufficient documentation

## 2013-07-06 DIAGNOSIS — N809 Endometriosis, unspecified: Secondary | ICD-10-CM

## 2013-07-06 DIAGNOSIS — N72 Inflammatory disease of cervix uteri: Secondary | ICD-10-CM | POA: Insufficient documentation

## 2013-07-06 DIAGNOSIS — N920 Excessive and frequent menstruation with regular cycle: Secondary | ICD-10-CM | POA: Insufficient documentation

## 2013-07-06 DIAGNOSIS — N926 Irregular menstruation, unspecified: Secondary | ICD-10-CM

## 2013-07-06 HISTORY — PX: ROBOTIC ASSISTED TOTAL HYSTERECTOMY: SHX6085

## 2013-07-06 HISTORY — PX: SALPINGOOPHORECTOMY: SHX82

## 2013-07-06 LAB — TYPE AND SCREEN: Antibody Screen: NEGATIVE

## 2013-07-06 SURGERY — ROBOTIC ASSISTED TOTAL HYSTERECTOMY
Anesthesia: General | Site: Uterus | Wound class: Clean Contaminated

## 2013-07-06 MED ORDER — FENTANYL CITRATE 0.05 MG/ML IJ SOLN
INTRAMUSCULAR | Status: AC
  Filled 2013-07-06: qty 2

## 2013-07-06 MED ORDER — KETOROLAC TROMETHAMINE 30 MG/ML IJ SOLN
30.0000 mg | Freq: Four times a day (QID) | INTRAMUSCULAR | Status: DC
  Filled 2013-07-06 (×4): qty 1

## 2013-07-06 MED ORDER — GLYCOPYRROLATE 0.2 MG/ML IJ SOLN
INTRAMUSCULAR | Status: DC | PRN
  Administered 2013-07-06: .5 mg via INTRAVENOUS

## 2013-07-06 MED ORDER — METRONIDAZOLE IN NACL 5-0.79 MG/ML-% IV SOLN
INTRAVENOUS | Status: AC
  Filled 2013-07-06: qty 100

## 2013-07-06 MED ORDER — ROCURONIUM BROMIDE 100 MG/10ML IV SOLN
INTRAVENOUS | Status: DC | PRN
  Administered 2013-07-06: 50 mg via INTRAVENOUS
  Administered 2013-07-06: 20 mg via INTRAVENOUS

## 2013-07-06 MED ORDER — FENTANYL CITRATE 0.05 MG/ML IJ SOLN
50.0000 ug | INTRAMUSCULAR | Status: DC | PRN
  Administered 2013-07-06: 100 ug via INTRAVENOUS
  Administered 2013-07-06 (×2): 50 ug via INTRAVENOUS

## 2013-07-06 MED ORDER — LACTATED RINGERS IV SOLN
INTRAVENOUS | Status: DC | PRN
  Administered 2013-07-06: 10:00:00 via INTRAVENOUS

## 2013-07-06 MED ORDER — HYDROMORPHONE HCL PF 1 MG/ML IJ SOLN
INTRAMUSCULAR | Status: AC
  Filled 2013-07-06: qty 1

## 2013-07-06 MED ORDER — MAGNESIUM HYDROXIDE 400 MG/5ML PO SUSP
30.0000 mL | Freq: Two times a day (BID) | ORAL | Status: DC
  Filled 2013-07-06: qty 30

## 2013-07-06 MED ORDER — BUPIVACAINE HCL (PF) 0.25 % IJ SOLN
INTRAMUSCULAR | Status: AC
  Filled 2013-07-06: qty 30

## 2013-07-06 MED ORDER — ZOLPIDEM TARTRATE 5 MG PO TABS: 5.0000 mg | ORAL_TABLET | Freq: Every evening | ORAL | Status: DC | PRN

## 2013-07-06 MED ORDER — SODIUM CHLORIDE 0.45 % IV SOLN
INTRAVENOUS | Status: DC
  Administered 2013-07-06 (×2): via INTRAVENOUS

## 2013-07-06 MED ORDER — OXYCODONE-ACETAMINOPHEN 5-325 MG PO TABS
1.0000 | ORAL_TABLET | ORAL | Status: DC | PRN
  Administered 2013-07-06 – 2013-07-07 (×4): 2 via ORAL
  Filled 2013-07-06 (×4): qty 2

## 2013-07-06 MED ORDER — CEFAZOLIN SODIUM-DEXTROSE 2-3 GM-% IV SOLR
2.0000 g | INTRAVENOUS | Status: AC
  Administered 2013-07-06: 2 g via INTRAVENOUS

## 2013-07-06 MED ORDER — MENTHOL 3 MG MT LOZG: 1.0000 | LOZENGE | OROMUCOSAL | Status: DC | PRN

## 2013-07-06 MED ORDER — ONDANSETRON HCL 4 MG/2ML IJ SOLN: 4.0000 mg | Freq: Four times a day (QID) | INTRAMUSCULAR | Status: DC | PRN

## 2013-07-06 MED ORDER — MIDAZOLAM HCL 5 MG/5ML IJ SOLN
INTRAMUSCULAR | Status: DC | PRN
  Administered 2013-07-06: 2 mg via INTRAVENOUS

## 2013-07-06 MED ORDER — HYDROMORPHONE HCL PF 1 MG/ML IJ SOLN
INTRAMUSCULAR | Status: DC | PRN
  Administered 2013-07-06: 1 mg via INTRAVENOUS
  Administered 2013-07-06 (×2): 0.5 mg via INTRAVENOUS

## 2013-07-06 MED ORDER — ONDANSETRON HCL 4 MG/2ML IJ SOLN
INTRAMUSCULAR | Status: DC | PRN
  Administered 2013-07-06: 4 mg via INTRAVENOUS

## 2013-07-06 MED ORDER — NEOSTIGMINE METHYLSULFATE 1 MG/ML IJ SOLN
INTRAMUSCULAR | Status: DC | PRN
  Administered 2013-07-06: 3.5 mg via INTRAVENOUS

## 2013-07-06 MED ORDER — HYDROMORPHONE HCL PF 1 MG/ML IJ SOLN
0.2000 mg | INTRAMUSCULAR | Status: DC | PRN
  Administered 2013-07-06 – 2013-07-07 (×3): 0.5 mg via INTRAVENOUS
  Filled 2013-07-06 (×3): qty 1

## 2013-07-06 MED ORDER — HYDROMORPHONE HCL PF 1 MG/ML IJ SOLN
0.2500 mg | INTRAMUSCULAR | Status: DC | PRN
  Administered 2013-07-06 (×2): 0.5 mg via INTRAVENOUS

## 2013-07-06 MED ORDER — PANTOPRAZOLE SODIUM 40 MG PO TBEC
40.0000 mg | DELAYED_RELEASE_TABLET | Freq: Every day | ORAL | Status: DC
  Administered 2013-07-06 – 2013-07-07 (×2): 40 mg via ORAL
  Filled 2013-07-06 (×2): qty 1

## 2013-07-06 MED ORDER — LACTATED RINGERS IV SOLN: INTRAVENOUS | Status: DC

## 2013-07-06 MED ORDER — LACTATED RINGERS IV SOLN
INTRAVENOUS | Status: DC
  Administered 2013-07-06: 1000 mL via INTRAVENOUS

## 2013-07-06 MED ORDER — METRONIDAZOLE IN NACL 5-0.79 MG/ML-% IV SOLN
INTRAVENOUS | Status: DC | PRN
  Administered 2013-07-06: 500 mg via INTRAVENOUS

## 2013-07-06 MED ORDER — ONDANSETRON HCL 4 MG PO TABS: 4.0000 mg | ORAL_TABLET | Freq: Four times a day (QID) | ORAL | Status: DC | PRN

## 2013-07-06 MED ORDER — PROPOFOL 10 MG/ML IV BOLUS
INTRAVENOUS | Status: DC | PRN
  Administered 2013-07-06: 180 mg via INTRAVENOUS

## 2013-07-06 MED ORDER — LIDOCAINE HCL (CARDIAC) 20 MG/ML IV SOLN
INTRAVENOUS | Status: DC | PRN
  Administered 2013-07-06: 80 mg via INTRAVENOUS

## 2013-07-06 MED ORDER — DEXAMETHASONE SODIUM PHOSPHATE 10 MG/ML IJ SOLN
INTRAMUSCULAR | Status: DC | PRN
  Administered 2013-07-06: 10 mg via INTRAVENOUS

## 2013-07-06 MED ORDER — KETOROLAC TROMETHAMINE 30 MG/ML IJ SOLN
INTRAMUSCULAR | Status: AC
  Filled 2013-07-06: qty 1

## 2013-07-06 MED ORDER — METRONIDAZOLE IN NACL 5-0.79 MG/ML-% IV SOLN: 500.0000 mg | Freq: Once | INTRAVENOUS | Status: DC

## 2013-07-06 MED ORDER — LACTATED RINGERS IR SOLN
Status: DC | PRN
  Administered 2013-07-06: 100 mL

## 2013-07-06 MED ORDER — KETOROLAC TROMETHAMINE 30 MG/ML IJ SOLN
30.0000 mg | Freq: Once | INTRAMUSCULAR | Status: AC
  Administered 2013-07-06: 30 mg via INTRAVENOUS

## 2013-07-06 MED ORDER — KETOROLAC TROMETHAMINE 30 MG/ML IJ SOLN
30.0000 mg | Freq: Four times a day (QID) | INTRAMUSCULAR | Status: DC
  Administered 2013-07-06 – 2013-07-07 (×4): 30 mg via INTRAVENOUS
  Filled 2013-07-06 (×7): qty 1

## 2013-07-06 MED ORDER — CEFAZOLIN SODIUM-DEXTROSE 2-3 GM-% IV SOLR
INTRAVENOUS | Status: AC
  Filled 2013-07-06: qty 50

## 2013-07-06 MED ORDER — FENTANYL CITRATE 0.05 MG/ML IJ SOLN
INTRAMUSCULAR | Status: DC | PRN
  Administered 2013-07-06 (×3): 50 ug via INTRAVENOUS

## 2013-07-06 MED ORDER — BUPIVACAINE HCL (PF) 0.25 % IJ SOLN
INTRAMUSCULAR | Status: DC | PRN
  Administered 2013-07-06: 10 mL

## 2013-07-06 MED ORDER — SIMETHICONE 80 MG PO CHEW
80.0000 mg | CHEWABLE_TABLET | Freq: Four times a day (QID) | ORAL | Status: DC | PRN
  Filled 2013-07-06: qty 1

## 2013-07-06 SURGICAL SUPPLY — 53 items
BENZOIN TINCTURE PRP APPL 2/3 (GAUZE/BANDAGES/DRESSINGS) ×3 IMPLANT
CATH FOLEY LATEX FREE 16FR (CATHETERS) ×3 IMPLANT
CHLORAPREP W/TINT 26ML (MISCELLANEOUS) ×3 IMPLANT
CLOTH BEACON ORANGE TIMEOUT ST (SAFETY) ×3 IMPLANT
CORD HIGH FREQUENCY UNIPOLAR (ELECTROSURGICAL) ×3 IMPLANT
CORDS BIPOLAR (ELECTRODE) ×3 IMPLANT
COVER MAYO STAND STRL (DRAPES) ×3 IMPLANT
COVER SURGICAL LIGHT HANDLE (MISCELLANEOUS) ×3 IMPLANT
COVER TIP SHEARS 8 DVNC (MISCELLANEOUS) ×2 IMPLANT
COVER TIP SHEARS 8MM DA VINCI (MISCELLANEOUS) ×1
DECANTER SPIKE VIAL GLASS SM (MISCELLANEOUS) IMPLANT
DRAPE LG THREE QUARTER DISP (DRAPES) ×6 IMPLANT
DRAPE SURG IRRIG POUCH 19X23 (DRAPES) ×3 IMPLANT
DRAPE TABLE BACK 44X90 PK DISP (DRAPES) ×6 IMPLANT
DRAPE UTILITY XL STRL (DRAPES) ×3 IMPLANT
DRAPE WARM FLUID 44X44 (DRAPE) ×3 IMPLANT
DRSG TEGADERM 6X8 (GAUZE/BANDAGES/DRESSINGS) ×6 IMPLANT
ELECT REM PT RETURN 9FT ADLT (ELECTROSURGICAL) ×3
ELECTRODE REM PT RTRN 9FT ADLT (ELECTROSURGICAL) ×2 IMPLANT
GAUZE VASELINE 3X9 (GAUZE/BANDAGES/DRESSINGS) IMPLANT
GLOVE BIO SURGEON STRL SZ 6.5 (GLOVE) ×12 IMPLANT
GLOVE BIO SURGEON STRL SZ7.5 (GLOVE) ×6 IMPLANT
GLOVE BIO SURGEON STRL SZ8 (GLOVE) ×6 IMPLANT
GLOVE BIOGEL PI IND STRL 7.0 (GLOVE) ×4 IMPLANT
GLOVE BIOGEL PI INDICATOR 7.0 (GLOVE) ×2
GOWN PREVENTION PLUS XLARGE (GOWN DISPOSABLE) IMPLANT
GOWN STRL NON-REIN LRG LVL3 (GOWN DISPOSABLE) IMPLANT
HOLDER FOLEY CATH W/STRAP (MISCELLANEOUS) ×3 IMPLANT
KIT ACCESSORY DA VINCI DISP (KITS) ×1
KIT ACCESSORY DVNC DISP (KITS) ×2 IMPLANT
MANIPULATOR UTERINE 4.5 ZUMI (MISCELLANEOUS) ×3 IMPLANT
OCCLUDER COLPOPNEUMO (BALLOONS) ×3 IMPLANT
PACK LAPAROSCOPY W LONG (CUSTOM PROCEDURE TRAY) ×3 IMPLANT
POUCH SPECIMEN RETRIEVAL 10MM (ENDOMECHANICALS) IMPLANT
SET TUBE IRRIG SUCTION NO TIP (IRRIGATION / IRRIGATOR) ×3 IMPLANT
SHEET LAVH (DRAPES) ×3 IMPLANT
SOLUTION ELECTROLUBE (MISCELLANEOUS) ×3 IMPLANT
SPONGE LAP 18X18 X RAY DECT (DISPOSABLE) IMPLANT
STRIP CLOSURE SKIN 1/2X4 (GAUZE/BANDAGES/DRESSINGS) ×3 IMPLANT
SUT VIC AB 0 CT1 27 (SUTURE) ×3
SUT VIC AB 0 CT1 27XBRD ANTBC (SUTURE) ×6 IMPLANT
SUT VIC AB 4-0 PS2 27 (SUTURE) ×6 IMPLANT
SUT VICRYL 0 UR6 27IN ABS (SUTURE) ×3 IMPLANT
SYR 50ML LL SCALE MARK (SYRINGE) ×3 IMPLANT
SYR BULB IRRIGATION 50ML (SYRINGE) IMPLANT
TOWEL OR 17X26 10 PK STRL BLUE (TOWEL DISPOSABLE) ×6 IMPLANT
TOWEL OR NON WOVEN STRL DISP B (DISPOSABLE) ×3 IMPLANT
TRAP SPECIMEN MUCOUS 40CC (MISCELLANEOUS) IMPLANT
TRAY FOLEY CATH 14FRSI W/METER (CATHETERS) IMPLANT
TROCAR 12M 150ML BLUNT (TROCAR) ×3 IMPLANT
TROCAR XCEL 12X100 BLDLESS (ENDOMECHANICALS) ×3 IMPLANT
TROCAR XCEL NON-BLD 5MMX100MML (ENDOMECHANICALS) ×6 IMPLANT
WATER STERILE IRR 1500ML POUR (IV SOLUTION) ×6 IMPLANT

## 2013-07-06 NOTE — Anesthesia Postprocedure Evaluation (Signed)
  Anesthesia Post-op Note  Patient: Andrea Waters  Procedure(s) Performed: Procedure(s) (LRB): ROBOTIC ASSISTED TOTAL HYSTERECTOMY (N/A) SALPINGO OOPHORECTOMY (Left)  Patient Location: PACU  Anesthesia Type: General  Level of Consciousness: awake and alert   Airway and Oxygen Therapy: Patient Spontanous Breathing  Post-op Pain: mild  Post-op Assessment: Post-op Vital signs reviewed, Patient's Cardiovascular Status Stable, Respiratory Function Stable, Patent Airway and No signs of Nausea or vomiting  Last Vitals:  Filed Vitals:   07/06/13 1400  BP: 121/73  Pulse: 58  Temp:   Resp: 10    Post-op Vital Signs: stable   Complications: No apparent anesthesia complications

## 2013-07-06 NOTE — Interval H&P Note (Signed)
History and Physical Interval Note:  07/06/2013 10:45 AM  Andrea Waters  has presented today for surgery, with the diagnosis of Endometriosis/ pelvic pain  The various methods of treatment have been discussed with the patient and family. After consideration of risks, benefits and other options for treatment, the patient has consented to  Procedure(s): ROBOTIC ASSISTED TOTAL HYSTERECTOMY (N/A) SALPINGO OOPHORECTOMY (Left) as a surgical intervention .  The patient's history has been reviewed, patient examined, no change in status, stable for surgery.  I have reviewed the patient's chart and labs.  Questions were answered to the patient's satisfaction.     JACKSON-MOORE,Akshaya Toepfer A

## 2013-07-06 NOTE — Preoperative (Signed)
Beta Blockers   Reason not to administer Beta Blockers:Not Applicable 

## 2013-07-06 NOTE — Anesthesia Preprocedure Evaluation (Addendum)
Anesthesia Evaluation  Patient identified by MRN, date of birth, ID band Patient awake    Reviewed: Allergy & Precautions, H&P , NPO status , Patient's Chart, lab work & pertinent test results  History of Anesthesia Complications (+) PONV and MALIGNANT HYPERTHERMIA  Airway Mallampati: II TM Distance: >3 FB Neck ROM: full    Dental no notable dental hx. (+) Teeth Intact and Dental Advisory Given   Pulmonary neg pulmonary ROS,  breath sounds clear to auscultation  Pulmonary exam normal       Cardiovascular Exercise Tolerance: Good negative cardio ROS  Rhythm:regular Rate:Normal     Neuro/Psych negative neurological ROS  negative psych ROS   GI/Hepatic negative GI ROS, Neg liver ROS,   Endo/Other  negative endocrine ROS  Renal/GU negative Renal ROS  negative genitourinary   Musculoskeletal   Abdominal   Peds  Hematology negative hematology ROS (+)   Anesthesia Other Findings   Reproductive/Obstetrics negative OB ROS                         Anesthesia Physical Anesthesia Plan  ASA: I  Anesthesia Plan: General   Post-op Pain Management:    Induction: Intravenous  Airway Management Planned: Oral ETT  Additional Equipment:   Intra-op Plan:   Post-operative Plan: Extubation in OR  Informed Consent: I have reviewed the patients History and Physical, chart, labs and discussed the procedure including the risks, benefits and alternatives for the proposed anesthesia with the patient or authorized representative who has indicated his/her understanding and acceptance.   Dental Advisory Given  Plan Discussed with: CRNA and Surgeon  Anesthesia Plan Comments:         Anesthesia Quick Evaluation

## 2013-07-06 NOTE — Op Note (Signed)
Pre-operative Diagnosis: abnormal uterine bleeding and pelvic pain  Post-operative Diagnosis: same  Operation: Robotic-assisted hysterectomy with left salpingo oophorectomy Surgeon: Roseanna Rainbow  Assistant: Coral Ceo, MD  Anesthesia: GET  Urine Output: per Anesthesiology  Findings: Allen-Masters windows in the peritoneum overlying the posterior cul-de-sac.  There were filmy adhesions of the posterior cul-de-sac.  The peritoneal surfaces of the pelvis had petechiae-like lesions.  The right tube was absent.  The ovaries and uterus were normal in appearance.  Estimated Blood Loss:  Minimal                 Total IV Fluids: per Anesthesiology         Specimens: PATHOLOGY               Complications:  None; patient tolerated the procedure well.         Disposition: PACU - hemodynamically stable.         Condition: Stable    Procedure Details  The patient was seen in the Holding Room. The risks, benefits, complications, treatment options, and expected outcomes were discussed with the patient.  The patient concurred with the proposed plan, giving informed consent.  The site of surgery properly noted/marked. The patient was identified as Andrea Waters and the procedure verified as a Robotic-assisted hysterectomy with left-salpingo oophorectomy. A Time Out was held and the above information confirmed.  After induction of anesthesia, the patient was draped and prepped in the usual sterile manner. Pt was placed in supine position after anesthesia and draped and prepped in the usual sterile manner. The abdominal drape was placed after the CholoraPrep had been allowed to dry for 3 minutes.  Her arms were tucked to her side with all appropriate precautions.  The shoulder blocks were placed in the usual fashion.  The patient was placed in the semi-lithotomy position in [place] stirrups.  The perineum was prepped with Betadine.  Foley catheter was placed.  A sterile speculum  was placed in the vagina.  The cervix was grasped with a single-tooth tenaculum and dilated with Shawnie Pons dilators.  The ZUMI uterine manipulator with a medium colpotomizer ring was placed without difficulty.  A pneum occluder balloon was placed over the manipulator.  A second time-out was performed.  OG tube placement was confirmed and to suction.  Approximately 2 cm below the costal margin, in the midclavicular line the skin was anesthestized with 0.25% Marcaine.  A 5 mm incision was made and using a 5 mm Optiview, a 5 mm trocar was placed under direct vision.  The patient's abdomen was insufflated with CO2 gas.  At this point and all points during the procedure, the patient's intra-abdominal pressure did not exceed 15 mmHg.  A 10-12 camera port was place 24 cm above the pubic symphysis.  Bilateral 8 mm ports were place 10 cm and 15 degrees inferior.  All ports were placed under direct visualization.  The 5 mm port was removed.  The incision was extended to accommodate a 10 mm trocar.  A 10 mm trocar and sleeve were advanced under direct visualization.   The robot was docked in the usual fashion.  The round ligament on the right was transected with monopolar cautery.  The anterior and posterior leaves of the broad ligament were opened.  The ureter was identified.  A window was made between the infundibulopelvic ligament and the ureter.  The right utero-ovarian ligament was coagulated with bipolar cautery and transected.  The uterine vessels were skeletonized to  the level of the Koh ring. The uterine vessels were coagulated with bipolar cautery and transected.   A C-loop was created in the usual fashion.  A similar procedure was performed on the patient's left side. The round ligament on the left was transected with monopolar cautery.  The anterior and posterior leaves of the broad ligament were opened.  The ureter was identified.  A window was made between the infundibulopelvic ligament and the ureter.  The left  infundibulo-pelvic ligament was was coagulated with bipolar cautery and transected.  The uterine vessels were skeletonized to the level of the Koh ring. The uterine vessels were coagulated with bipolar cautery and transected.  A C-loop was created in the usual fashion.   The bladder flap was completed.  At this time, the pneum occluder balloon was insufflated and a colpotomy was performed.  The uterus, cervix, left tube and ovary were delivered through the vagina.  All pedicles were inspected under low intraabdominal pressures and noted to be hemostatic.  The vagina cuff was closed using 0-Vicryl on a CT-1 needle in a running fashion.  The abdomen and pelvis were copiously irrigated.  The needle was removed without difficulty.  The instruments were then removed under direct visualization and the robotic ports removed.  The robot was undocked.  Deep, subcutaneous, figure-of-eight 0-Vicryl sutures on a UR-6 needle were placed in the 10-12 mm supraumbilical and infracostal incisions.  All skin incisions were closed in a subcuticular fashion using 3-0 Monocryl.  Steri-strips and Benzoin were applied.  The vagina was swabbed with minimal bleeding noted.   All instrument and needle counts were correct x  2.

## 2013-07-06 NOTE — Transfer of Care (Signed)
Immediate Anesthesia Transfer of Care Note  Patient: Andrea Waters  Procedure(s) Performed: Procedure(s): ROBOTIC ASSISTED TOTAL HYSTERECTOMY (N/A) SALPINGO OOPHORECTOMY (Left)  Patient Location: PACU  Anesthesia Type:General  Level of Consciousness: awake, alert , oriented and patient cooperative  Airway & Oxygen Therapy: Pr spontaneously breathing and on Face Mask  Post-op Assessment: Report given to PACU RN, Post -op Vital signs reviewed and stable and Patient moving all extremities  Post vital signs: Reviewed and stable  Complications: No apparent anesthesia complications

## 2013-07-07 LAB — COMPREHENSIVE METABOLIC PANEL
ALT: 19 U/L (ref 0–35)
Calcium: 9.3 mg/dL (ref 8.4–10.5)
Creatinine, Ser: 0.75 mg/dL (ref 0.50–1.10)
GFR calc Af Amer: >90 mL/min (ref >90)
Glucose, Bld: 111 mg/dL — ABNORMAL HIGH (ref 70–99)
Sodium: 137 mEq/L (ref 135–145)
Total Protein: 6.4 g/dL (ref 6.0–8.3)

## 2013-07-07 LAB — CBC
Hemoglobin: 11.3 g/dL — ABNORMAL LOW (ref 12.0–15.0)
MCH: 31.2 pg (ref 26.0–34.0)
MCHC: 32.9 g/dL (ref 30.0–36.0)
MCV: 94.8 fL (ref 78.0–100.0)
Platelets: 262 10*3/uL (ref 150–400)

## 2013-07-07 MED ORDER — IBUPROFEN 800 MG PO TABS: 800.0000 mg | ORAL_TABLET | Freq: Three times a day (TID) | ORAL | Status: DC | PRN

## 2013-07-07 MED ORDER — OXYCODONE-ACETAMINOPHEN 10-325 MG PO TABS: 1.0000 | ORAL_TABLET | Freq: Four times a day (QID) | ORAL | Status: DC | PRN

## 2013-07-07 NOTE — Progress Notes (Signed)
Assessment unchanged. Pt verbalized understanding of dc instructions via teach back. Script x 1 given as provided by MD. Pt understands to call for follow up appointment Monday. Pt informed that Dr. Clearance Coots called Ibuprofen 800 mg into [facility] on [address] of [address] with verbalized understanding. Stated " they have already called from my pharmacy to let me know I have meds waiting for me." Discharged via wc to front entrance to meet awaiting vehicle to carry home. Accompanied by NT and friend x 1.

## 2013-07-07 NOTE — Discharge Summary (Signed)
Physician Discharge Summary  Patient ID: GEARLDENE FIORENZA MRN: [id number] DOB/AGE: [birth year] 32 y.o.  Admit date: 07/06/2013 Discharge date: 07/07/2013  Admission Diagnoses:  Menorrhagia, Pelvic Pain  Discharge Diagnoses: Same Active Problems:   * No active hospital problems. *   Discharged Condition: good  Hospital Course: Underwent Robotic Assisted TLH / LSO.  No complications.  Post op course uncomplicated.  Discharged home in good condition.  Consults: None  Significant Diagnostic Studies: labs: CBC, CMET  Treatments: IV hydration and analgesia: Percocet  Discharge Exam: Blood pressure 140/81, pulse 50, temperature 98.4 F (36.9 C), temperature source Oral, resp. rate 18, height 5\' 3"  (1.6 m), weight 141 lb 9 oz (64.213 kg), SpO2 97.00%. General appearance: alert and no distress Resp: clear to auscultation bilaterally Cardio: regular rate and rhythm, S1, S2 normal, no murmur, click, rub or gallop GI: normal findings: soft, non-tender Extremities: extremities normal, atraumatic, no cyanosis or edema Incisions:  C, D, I. Disposition: 01-Home or Self Care  Discharge Orders   Future Orders Complete By Expires     (HEART FAILURE PATIENTS) Call MD:  Anytime you have any of the following symptoms: 1) 3 pound weight gain in 24 hours or 5 pounds in 1 week 2) shortness of breath, with or without a dry hacking cough 3) swelling in the hands, feet or stomach 4) if you have to sleep on extra pillows at night in order to breathe.  As directed     Call MD for:  difficulty breathing, headache or visual disturbances  As directed     Call MD for:  extreme fatigue  As directed     Call MD for:  hives  As directed     Call MD for:  persistant dizziness or light-headedness  As directed     Call MD for:  persistant nausea and vomiting  As directed     Call MD for:  redness, tenderness, or signs of infection (pain, swelling, redness, odor or green/yellow discharge around incision site)  As  directed     Call MD for:  severe uncontrolled pain  As directed     Call MD for:  temperature >100.4  As directed     Call MD for:  As directed     Diet - low sodium heart healthy  As directed     Discharge instructions  As directed     Comments:      Routine    Discharge wound care:  As directed     Comments:      Keep incisions clean and dry.    Driving Restrictions  As directed     Comments:      No driving for 2 weeks.    Increase activity slowly  As directed     Lifting restrictions  As directed     Comments:      No lifting > 20 lbs.    No dressing needed  As directed     Sexual Activity Restrictions  As directed     Comments:      No sex for 8 weeks.        Medication List    STOP taking these medications       norethindrone 5 MG tablet  Commonly known as:  AYGESTIN      TAKE these medications       multivitamin with minerals tablet  Take 1 tablet by mouth daily.     oxyCODONE-acetaminophen 10-325 MG per tablet  Commonly  known as:  PERCOCET  Take 1 tablet by mouth every 6 (six) hours as needed for pain.     oxyCODONE-acetaminophen 10-325 MG per tablet  Commonly known as:  PERCOCET  Take 1 tablet by mouth every 6 (six) hours as needed for pain.           Follow-up Information   Follow up with Antionette Char A, MD. Schedule an appointment as soon as possible for a visit in 2 weeks.   Contact information:   [address] [place] [address] [phone]       Signed: Brock Bad 07/07/2013, 7:18 AM

## 2013-07-07 NOTE — Progress Notes (Signed)
1 Day Post-Op Procedure(s) (LRB): ROBOTIC ASSISTED TOTAL HYSTERECTOMY (N/A) SALPINGO OOPHORECTOMY (Left)  Subjective: Patient reports tolerating PO, + flatus, + BM and no problems voiding.    Objective: I have reviewed patient's vital signs, intake and output, medications and labs.  General: alert and no distress Resp: clear to auscultation bilaterally Cardio: regular rate and rhythm, S1, S2 normal, no murmur, click, rub or gallop GI: normal findings: soft, non-tender Extremities: extremities normal, atraumatic, no cyanosis or edema Vaginal Bleeding: none  Assessment: s/p Procedure(s): ROBOTIC ASSISTED TOTAL HYSTERECTOMY (N/A) SALPINGO OOPHORECTOMY (Left): stable and tolerating diet  Plan: Discharge home  LOS: 1 day    [name] A 07/07/2013, 7:08 AM

## 2013-07-09 ENCOUNTER — Encounter (HOSPITAL_COMMUNITY): Payer: Self-pay | Admitting: Obstetrics & Gynecology

## 2013-07-16 ENCOUNTER — Encounter: Payer: Self-pay | Admitting: Obstetrics

## 2013-07-18 ENCOUNTER — Encounter: Payer: Self-pay | Admitting: Obstetrics & Gynecology

## 2013-07-18 ENCOUNTER — Encounter: Admitting: Obstetrics

## 2013-07-25 ENCOUNTER — Encounter: Payer: Self-pay | Admitting: Obstetrics

## 2013-07-25 ENCOUNTER — Ambulatory Visit (INDEPENDENT_AMBULATORY_CARE_PROVIDER_SITE_OTHER): Admitting: Obstetrics

## 2013-07-25 VITALS — BP 122/70 | HR 94 | Temp 98.7°F | Ht 63.0 in | Wt 139.2 lb

## 2013-07-25 DIAGNOSIS — N949 Unspecified condition associated with female genital organs and menstrual cycle: Secondary | ICD-10-CM | POA: Insufficient documentation

## 2013-07-25 NOTE — Progress Notes (Signed)
.   Subjective:     Andrea Waters is a 32 y.o. female who presents to the clinic 3 weeks status post total abdominal hysterectomy for pelvic pain. Eating a regular diet without difficulty. Bowel movements are normal. Pain is controlled with current analgesics. Medications being used: narcotic analgesics including percocoet/flexeril.  The following portions of the patient's history were reviewed and updated as appropriate: allergies, current medications, past family history, past medical history, past social history, past surgical history and problem list.  Review of Systems Pertinent items are noted in HPI.    Objective:    BP 122/70  Pulse 94  Temp(Src) 98.7 F (37.1 C) (Oral)  Ht 5\' 3"  (1.6 m)  Wt 139 lb 3.2 oz (63.141 kg)  BMI 24.66 kg/m2 General:  alert and no distress  Abdomen: soft, bowel sounds active, non-tender, incision ports C, D, I.  Incision:    Port sites healing well.     Assessment:    Doing well postoperatively. Operative findings again reviewed. Pathology report discussed.    Plan:    1. Continue any current medications. 2. Wound care discussed. 3. Activity restrictions: no lifting more than 20 pounds and no strenuous activity. 4. Anticipated return to work: now. 5. Follow up: 4 weeks .

## 2013-08-09 ENCOUNTER — Encounter (HOSPITAL_COMMUNITY): Payer: Self-pay | Admitting: Emergency Medicine

## 2013-08-09 DIAGNOSIS — T81329A Deep disruption or dehiscence of operation wound, unspecified, initial encounter: Secondary | ICD-10-CM | POA: Insufficient documentation

## 2013-08-09 DIAGNOSIS — Y838 Other surgical procedures as the cause of abnormal reaction of the patient, or of later complication, without mention of misadventure at the time of the procedure: Secondary | ICD-10-CM | POA: Insufficient documentation

## 2013-08-09 DIAGNOSIS — N949 Unspecified condition associated with female genital organs and menstrual cycle: Secondary | ICD-10-CM | POA: Insufficient documentation

## 2013-08-09 DIAGNOSIS — T8132XA Disruption of internal operation (surgical) wound, not elsewhere classified, initial encounter: Secondary | ICD-10-CM | POA: Insufficient documentation

## 2013-08-09 DIAGNOSIS — R109 Unspecified abdominal pain: Secondary | ICD-10-CM | POA: Insufficient documentation

## 2013-08-09 DIAGNOSIS — Z90710 Acquired absence of both cervix and uterus: Secondary | ICD-10-CM | POA: Insufficient documentation

## 2013-08-09 NOTE — ED Notes (Signed)
Pt. reports vaginal bleeding onset this evening , pt. stated history of hysterectomy 07/06/2013 at [facility] by Dr/ Christell Constant and Dr. Clearance Coots.

## 2013-08-10 ENCOUNTER — Inpatient Hospital Stay (HOSPITAL_COMMUNITY): Admitting: Anesthesiology

## 2013-08-10 ENCOUNTER — Encounter (HOSPITAL_COMMUNITY): Payer: Self-pay | Admitting: *Deleted

## 2013-08-10 ENCOUNTER — Emergency Department (HOSPITAL_COMMUNITY)

## 2013-08-10 ENCOUNTER — Encounter (HOSPITAL_COMMUNITY): Admission: EM | Disposition: A | Discharge status: Home / Self Care | Payer: Self-pay | Source: Home / Self Care

## 2013-08-10 ENCOUNTER — Inpatient Hospital Stay (HOSPITAL_COMMUNITY)
Admission: EM | Admit: 2013-08-10 | Discharge: 2013-08-10 | Disposition: A | Discharge status: Home / Self Care | Attending: Obstetrics & Gynecology | Admitting: Obstetrics & Gynecology

## 2013-08-10 ENCOUNTER — Encounter (HOSPITAL_COMMUNITY): Payer: Self-pay | Admitting: Anesthesiology

## 2013-08-10 DIAGNOSIS — N939 Abnormal uterine and vaginal bleeding, unspecified: Secondary | ICD-10-CM

## 2013-08-10 HISTORY — PX: ANTERIOR AND POSTERIOR REPAIR: SHX5121

## 2013-08-10 LAB — CBC WITH DIFFERENTIAL/PLATELET
Basophils Absolute: 0 10*3/uL (ref 0.0–0.1)
Basophils Relative: 0 % (ref 0–1)
HCT: 35 % — ABNORMAL LOW (ref 36.0–46.0)
Lymphocytes Relative: 56 % — ABNORMAL HIGH (ref 12–46)
MCHC: 34.3 g/dL (ref 30.0–36.0)
Monocytes Absolute: 0.5 10*3/uL (ref 0.1–1.0)
Neutro Abs: 2.1 10*3/uL (ref 1.7–7.7)
Platelets: 267 10*3/uL (ref 150–400)
RDW: 11.8 % (ref 11.5–15.5)
WBC: 6.6 10*3/uL (ref 4.0–10.5)

## 2013-08-10 LAB — COMPREHENSIVE METABOLIC PANEL
ALT: 12 U/L (ref 0–35)
AST: 15 U/L (ref 0–37)
Albumin: 3.9 g/dL (ref 3.5–5.2)
Calcium: 9.2 mg/dL (ref 8.4–10.5)
Chloride: 104 mEq/L (ref 96–112)
Creatinine, Ser: 0.68 mg/dL (ref 0.50–1.10)
Sodium: 139 mEq/L (ref 135–145)
Total Bilirubin: 0.3 mg/dL (ref 0.3–1.2)

## 2013-08-10 SURGERY — ANTERIOR (CYSTOCELE) AND POSTERIOR REPAIR (RECTOCELE)
Anesthesia: General | Site: Vagina | Wound class: Clean Contaminated

## 2013-08-10 MED ORDER — ACETAMINOPHEN 650 MG RE SUPP
650.0000 mg | RECTAL | Status: DC | PRN
  Filled 2013-08-10: qty 1

## 2013-08-10 MED ORDER — ONDANSETRON HCL 4 MG/2ML IJ SOLN
INTRAMUSCULAR | Status: AC
  Filled 2013-08-10: qty 2

## 2013-08-10 MED ORDER — DEXAMETHASONE SODIUM PHOSPHATE 10 MG/ML IJ SOLN
INTRAMUSCULAR | Status: DC | PRN
  Administered 2013-08-10: 10 mg via INTRAVENOUS

## 2013-08-10 MED ORDER — MIDAZOLAM HCL 2 MG/2ML IJ SOLN
INTRAMUSCULAR | Status: AC
  Filled 2013-08-10: qty 2

## 2013-08-10 MED ORDER — METRONIDAZOLE IN NACL 5-0.79 MG/ML-% IV SOLN
500.0000 mg | Freq: Once | INTRAVENOUS | Status: AC
  Administered 2013-08-10: 500 mg via INTRAVENOUS
  Filled 2013-08-10: qty 100

## 2013-08-10 MED ORDER — SODIUM CHLORIDE 0.9 % IJ SOLN: 3.0000 mL | INTRAMUSCULAR | Status: DC | PRN

## 2013-08-10 MED ORDER — 0.9 % SODIUM CHLORIDE (POUR BTL) OPTIME
TOPICAL | Status: DC | PRN
  Administered 2013-08-10: 1000 mL

## 2013-08-10 MED ORDER — KETOROLAC TROMETHAMINE 30 MG/ML IJ SOLN
INTRAMUSCULAR | Status: DC | PRN
  Administered 2013-08-10: 30 mg via INTRAVENOUS

## 2013-08-10 MED ORDER — ONDANSETRON HCL 4 MG/2ML IJ SOLN
INTRAMUSCULAR | Status: DC | PRN
  Administered 2013-08-10: 4 mg via INTRAVENOUS

## 2013-08-10 MED ORDER — LIDOCAINE-EPINEPHRINE 0.5 %-1:200000 IJ SOLN
INTRAMUSCULAR | Status: AC
  Filled 2013-08-10: qty 1

## 2013-08-10 MED ORDER — ONDANSETRON HCL 4 MG/2ML IJ SOLN: 4.0000 mg | Freq: Four times a day (QID) | INTRAMUSCULAR | Status: DC | PRN

## 2013-08-10 MED ORDER — LACTATED RINGERS IV SOLN
INTRAVENOUS | Status: DC
  Administered 2013-08-10 (×3): via INTRAVENOUS

## 2013-08-10 MED ORDER — CEFAZOLIN SODIUM-DEXTROSE 2-3 GM-% IV SOLR
2.0000 g | INTRAVENOUS | Status: AC
  Administered 2013-08-10: 2 g via INTRAVENOUS

## 2013-08-10 MED ORDER — OXYCODONE HCL 5 MG PO TABS
5.0000 mg | ORAL_TABLET | ORAL | Status: DC | PRN
  Administered 2013-08-10: 10 mg via ORAL

## 2013-08-10 MED ORDER — MIDAZOLAM HCL 2 MG/2ML IJ SOLN
INTRAMUSCULAR | Status: DC | PRN
  Administered 2013-08-10: 2 mg via INTRAVENOUS

## 2013-08-10 MED ORDER — DEXTROSE 5 % IV SOLN: 3.0000 g | INTRAVENOUS | Status: DC

## 2013-08-10 MED ORDER — SODIUM CHLORIDE 0.9 % IJ SOLN: 3.0000 mL | Freq: Two times a day (BID) | INTRAMUSCULAR | Status: DC

## 2013-08-10 MED ORDER — SODIUM CHLORIDE 0.9 % IV BOLUS (SEPSIS)
1000.0000 mL | Freq: Once | INTRAVENOUS | Status: AC
  Administered 2013-08-10: 1000 mL via INTRAVENOUS

## 2013-08-10 MED ORDER — KETOROLAC TROMETHAMINE 30 MG/ML IJ SOLN: 30.0000 mg | Freq: Four times a day (QID) | INTRAMUSCULAR | Status: DC

## 2013-08-10 MED ORDER — OXYCODONE-ACETAMINOPHEN 5-325 MG PO TABS: 2.0000 | ORAL_TABLET | Freq: Four times a day (QID) | ORAL | Status: DC | PRN

## 2013-08-10 MED ORDER — LIDOCAINE HCL (CARDIAC) 20 MG/ML IV SOLN
INTRAVENOUS | Status: AC
  Filled 2013-08-10: qty 5

## 2013-08-10 MED ORDER — FAMOTIDINE IN NACL 20-0.9 MG/50ML-% IV SOLN: 20.0000 mg | Freq: Once | INTRAVENOUS | Status: DC

## 2013-08-10 MED ORDER — LIDOCAINE HCL (CARDIAC) 20 MG/ML IV SOLN
INTRAVENOUS | Status: DC | PRN
  Administered 2013-08-10: 50 mg via INTRAVENOUS

## 2013-08-10 MED ORDER — PROPOFOL 10 MG/ML IV EMUL
INTRAVENOUS | Status: AC
  Filled 2013-08-10: qty 20

## 2013-08-10 MED ORDER — HYDROMORPHONE HCL PF 1 MG/ML IJ SOLN
INTRAMUSCULAR | Status: AC
  Administered 2013-08-10: 0.5 mg via INTRAVENOUS
  Filled 2013-08-10: qty 1

## 2013-08-10 MED ORDER — MEPERIDINE HCL 25 MG/ML IJ SOLN: 6.2500 mg | INTRAMUSCULAR | Status: DC | PRN

## 2013-08-10 MED ORDER — SODIUM CHLORIDE 0.9 % IV SOLN: 250.0000 mL | INTRAVENOUS | Status: DC | PRN

## 2013-08-10 MED ORDER — CEFAZOLIN SODIUM-DEXTROSE 2-3 GM-% IV SOLR
INTRAVENOUS | Status: AC
  Filled 2013-08-10: qty 50

## 2013-08-10 MED ORDER — OXYCODONE-ACETAMINOPHEN 5-325 MG PO TABS
ORAL_TABLET | ORAL | Status: AC
  Filled 2013-08-10: qty 2

## 2013-08-10 MED ORDER — HYDROMORPHONE HCL PF 1 MG/ML IJ SOLN
1.0000 mg | Freq: Once | INTRAMUSCULAR | Status: AC
  Administered 2013-08-10: 1 mg via INTRAVENOUS
  Filled 2013-08-10: qty 1

## 2013-08-10 MED ORDER — FENTANYL CITRATE 0.05 MG/ML IJ SOLN
INTRAMUSCULAR | Status: AC
  Filled 2013-08-10: qty 5

## 2013-08-10 MED ORDER — FENTANYL CITRATE 0.05 MG/ML IJ SOLN
INTRAMUSCULAR | Status: DC | PRN
  Administered 2013-08-10 (×3): 50 ug via INTRAVENOUS
  Administered 2013-08-10: 100 ug via INTRAVENOUS

## 2013-08-10 MED ORDER — LACTATED RINGERS IV SOLN
INTRAVENOUS | Status: DC
  Administered 2013-08-10: 125 mL/h via INTRAVENOUS

## 2013-08-10 MED ORDER — METOCLOPRAMIDE HCL 5 MG/ML IJ SOLN: 10.0000 mg | Freq: Once | INTRAMUSCULAR | Status: DC | PRN

## 2013-08-10 MED ORDER — PROPOFOL 10 MG/ML IV BOLUS
INTRAVENOUS | Status: DC | PRN
  Administered 2013-08-10: 180 mg via INTRAVENOUS
  Administered 2013-08-10: 20 mg via INTRAVENOUS

## 2013-08-10 MED ORDER — HYDROMORPHONE HCL PF 1 MG/ML IJ SOLN
0.2500 mg | INTRAMUSCULAR | Status: DC | PRN
  Administered 2013-08-10 (×2): 0.5 mg via INTRAVENOUS

## 2013-08-10 MED ORDER — ACETAMINOPHEN 325 MG PO TABS: 650.0000 mg | ORAL_TABLET | ORAL | Status: DC | PRN

## 2013-08-10 SURGICAL SUPPLY — 26 items
CATH ROBINSON RED A/P 16FR (CATHETERS) ×2 IMPLANT
CLOTH BEACON ORANGE TIMEOUT ST (SAFETY) ×2 IMPLANT
CONT PATH 16OZ SNAP LID 3702 (MISCELLANEOUS) IMPLANT
DECANTER SPIKE VIAL GLASS SM (MISCELLANEOUS) IMPLANT
DEVICE CAPIO SLIM SINGLE (INSTRUMENTS) IMPLANT
GLOVE BIO SURGEON STRL SZ 6.5 (GLOVE) ×2 IMPLANT
GLOVE BIOGEL PI IND STRL 6.5 (GLOVE) ×1 IMPLANT
GLOVE BIOGEL PI INDICATOR 6.5 (GLOVE) ×1
GOWN STRL REIN XL XLG (GOWN DISPOSABLE) ×8 IMPLANT
NEEDLE HYPO 22GX1.5 SAFETY (NEEDLE) IMPLANT
NEEDLE MAYO .5 CIRCLE (NEEDLE) ×2 IMPLANT
NEEDLE SPNL 22GX3.5 QUINCKE BK (NEEDLE) IMPLANT
NS IRRIG 1000ML POUR BTL (IV SOLUTION) ×2 IMPLANT
PACK VAGINAL WOMENS (CUSTOM PROCEDURE TRAY) ×2 IMPLANT
SCRUB PCMX 4 OZ (MISCELLANEOUS) ×2 IMPLANT
SUT CAPIO POLYGLYCOLIC (SUTURE) IMPLANT
SUT CHROMIC 2 0 SH (SUTURE) IMPLANT
SUT PDS AB 0 CT1 27 (SUTURE) ×2 IMPLANT
SUT VIC AB 0 CT1 27 (SUTURE)
SUT VIC AB 0 CT1 27XBRD ANBCTR (SUTURE) IMPLANT
SUT VIC AB 2-0 SH 27 (SUTURE)
SUT VIC AB 2-0 SH 27XBRD (SUTURE) IMPLANT
SYR 20CC LL (SYRINGE) IMPLANT
TOWEL OR 17X24 6PK STRL BLUE (TOWEL DISPOSABLE) ×4 IMPLANT
TRAY FOLEY CATH 14FR (SET/KITS/TRAYS/PACK) ×2 IMPLANT
WATER STERILE IRR 1000ML POUR (IV SOLUTION) ×2 IMPLANT

## 2013-08-10 NOTE — Anesthesia Postprocedure Evaluation (Signed)
  Anesthesia Post-op Note  Patient: Andrea Waters  Procedure(s) Performed: Procedure(s): VAGINAL CUFF REPAIR (N/A)  Patient Location: PACU  Anesthesia Type:General  Level of Consciousness: awake, alert  and oriented  Airway and Oxygen Therapy: Patient Spontanous Breathing  Post-op Pain: mild  Post-op Assessment: Post-op Vital signs reviewed, Patient's Cardiovascular Status Stable, Respiratory Function Stable, Patent Airway, No signs of Nausea or vomiting and Pain level controlled  Post-op Vital Signs: Reviewed and stable  Complications: No apparent anesthesia complications

## 2013-08-10 NOTE — ED Notes (Signed)
Report given to susan at [facility] ER

## 2013-08-10 NOTE — MAU Note (Signed)
Pt had robotic assisted hysterectomy on 07-06-13 by Dr. Tamela Oddi.  Pt was having intercourse when abd pain and a "gush"  Of bright red bleeding occurred.

## 2013-08-10 NOTE — Transfer of Care (Signed)
Immediate Anesthesia Transfer of Care Note  Patient: Andrea Waters  Procedure(s) Performed: Procedure(s): VAGINAL CUFF REPAIR (N/A)  Patient Location: PACU  Anesthesia Type:General  Level of Consciousness: awake  Airway & Oxygen Therapy: Patient Spontanous Breathing  Post-op Assessment: Report given to PACU RN  Post vital signs: stable  Filed Vitals:   08/10/13 1159  BP: 125/82  Pulse: 71  Temp: 36.3 C  Resp: 16    Complications: No apparent anesthesia complications

## 2013-08-10 NOTE — ED Notes (Signed)
Pt requesting pain medication, pt states she is very uncomfortable and in a lot of pain.

## 2013-08-10 NOTE — Op Note (Signed)
PREOPERATIVE DIAGNOSIS: Vaginal cuff dehiscence  POSTOPERATIVE DIAGNOSIS: Same  OPERATION PERFORMED: Vaginal cuff repair SURGEON:  JACKSON-MOORE,Geniva Lohnes A  ANESTHESIA: Laryngeal mask airway.  COMPLICATIONS: None.  ESTIMATED BLOOD LOSS: minimal    DESCRIPTION OF OPERATION: The patient was taken to the operating room, where LMA anesthesia was obtained without difficulty. The patient was prepped and draped in the normal sterile fashion in the dorsal lithotomy position using Allen stirrups.  A timeout was performed confirming the patient, Andrea Waters, procedure and allergy status.   Attention was then turned to the patient's vagina where the weighted speculum was placed into the posterior fornix and a curved Deaver was placed into the anterior fornix. There was complete disruption of the vaginal cuff.  The peritoneum was intact.  The vaginal cuff edges were hemostatic; no necrotic areas were noted.  An interrupted suture of 0-PDS was placed at the left vaginal angle.  The remainder of the cuff was closed with a running suture of 0-PDS starting from the right vaginal angle.  Adequate hemostasis was noted.  The vagina was irrigated.  The instruments were removed from the patient's vagina. All sponge, lap and needle counts were correct x2. The patient tolerated the procedure well, was awakened and transferred to the recovery room.

## 2013-08-10 NOTE — ED Notes (Signed)
MD at bedside. 

## 2013-08-10 NOTE — H&P (Signed)
Chief Complaint: 32 y.o.  who presents with postcoital bleeding, pain  Details of Present Illness: The is s/p a robotic-assisted hysterectomy on 8/1.  She recently had intercourse with associated pain and bleeding.  She presented to the [facility] ED.  BP 116/83  Pulse 71  Temp(Src) 98.1 F (36.7 C) (Oral)  Resp 18  Ht 5\' 3"  (1.6 m)  Wt 138 lb (62.596 kg)  BMI 24.45 kg/m2  SpO2 100%  Past Medical History  Diagnosis Date  . PONV (postoperative nausea and vomiting)   . History of blood transfusion 2006    [facility]  . Anemia   . Endometriosis     CAUSING PELVIC PAIN AND CRAMPING "LIKE CONTRACTIONS" - PREVIOUS TUBAL LIGATION AND ENDOMETRIAL ABLATION ( MARCH 2014) - NOT HAVING MENSTRUAL PERIODS   History   Social History  . Marital Status: Married    Spouse Name: N/A    Number of Children: N/A  . Years of Education: N/A   Occupational History  . Not on file.   Social History Main Topics  . Smoking status: Never Smoker   . Smokeless tobacco: Never Used  . Alcohol Use: Yes     Comment: social  . Drug Use: No  . Sexual Activity: Yes    Partners: Male    Birth Control/ Protection: Surgical   Other Topics Concern  . Not on file   Social History Narrative  . No narrative on file   History reviewed. No pertinent family history. Dg Abd 1 View  08/10/2013   *RADIOLOGY REPORT*  Clinical Data: Vaginal bleeding and diffuse abdominal pain.  ABDOMEN - 1 VIEW  Comparison: None.  Findings: Large volume formed stool, with colonic segments distended the left upper quadrant.  No overt obstruction.  No abnormal intra-abdominal mass effect or suspicious calcification. No acute osseous findings.  IMPRESSION: Large stool volume.  Negative for bowel obstruction.   Original Report Authenticated By: Tiburcio Pea   US Pelvis Complete  08/10/2013   *RADIOLOGY REPORT*  Clinical Data: Vaginal bleeding status post recent hysterectomy.  TRANSABDOMINAL ULTRASOUND OF PELVIS  Technique:   Transabdominal ultrasound examination of the pelvis was performed including evaluation of the uterus, ovaries, adnexal regions, and pelvic cul-de-sac.  Comparison:  01/02/2009.  Findings:  Uterus:  Surgically absent.  Unremarkable vaginal cuff, which is mildly thicker than the lower vaginal wall, not unexpected.  Fluid present throughout the vaginal canal.  No transvaginal imaging performed given recent surgery and adequate visualization.  Right ovary: Not seen.  Left ovary: Surgically absent.  Other Findings:  No free fluid  IMPRESSION:  1.  Unremarkable vaginal cuff.  Hemorrhage present within the vaginal canal. 2.  No pelvic peritoneal fluid. 3.  Remaining right ovary not seen.   Original Report Authenticated By: Tiburcio Pea   Pertinent items are noted in HPI.  Pre-Op Diagnosis: vaginal cuff   Planned Procedure: Procedure(s): VAGINAL CUFF REPAIR  I have reviewed the patient's history and have completed the physical exam and Andrea Waters is acceptable for surgery.  Roseanna Rainbow, MD 08/10/2013 11:04 AM

## 2013-08-10 NOTE — Anesthesia Preprocedure Evaluation (Addendum)
Anesthesia Evaluation  Patient identified by MRN, date of birth, ID band Patient awake    Reviewed: Allergy & Precautions, H&P , NPO status , Patient's Chart, lab work & pertinent test results  History of Anesthesia Complications (+) PONV  Airway Mallampati: III TM Distance: >3 FB Neck ROM: Full    Dental no notable dental hx. (+) Teeth Intact   Pulmonary neg pulmonary ROS,  breath sounds clear to auscultation- rhonchi  Pulmonary exam normal       Cardiovascular negative cardio ROS  Rhythm:Regular Rate:Normal     Neuro/Psych negative neurological ROS  negative psych ROS   GI/Hepatic negative GI ROS, Neg liver ROS,   Endo/Other  negative endocrine ROS  Renal/GU negative Renal ROS  negative genitourinary   Musculoskeletal negative musculoskeletal ROS (+)   Abdominal   Peds  Hematology negative hematology ROS (+)   Anesthesia Other Findings   Reproductive/Obstetrics Vaginal cuff tear/disruption                           Anesthesia Physical Anesthesia Plan  ASA: I and emergent  Anesthesia Plan: General   Post-op Pain Management:    Induction: Intravenous  Airway Management Planned: Oral ETT  Additional Equipment:   Intra-op Plan:   Post-operative Plan: Extubation in OR  Informed Consent: I have reviewed the patients History and Physical, chart, labs and discussed the procedure including the risks, benefits and alternatives for the proposed anesthesia with the patient or authorized representative who has indicated his/her understanding and acceptance.   Dental advisory given  Plan Discussed with: CRNA, Anesthesiologist and Surgeon  Anesthesia Plan Comments:        Anesthesia Quick Evaluation

## 2013-08-10 NOTE — ED Provider Notes (Signed)
CSN: [id number]     Arrival date & time 08/09/13  2343 History   First MD Initiated Contact with Patient 08/10/13 0230     Chief Complaint  Patient presents with  . Vaginal Bleeding   (Consider location/radiation/quality/duration/timing/severity/associated sxs/prior Treatment) HPI Comments: 32 yo female presents after having acute vaginal and lower abdominal pain after having intercourse. She has soaked one pad. Had a hysterectomy just over 1 month ago (8/1). The pain is a sharp pain, is 10/10. Does not radiate. She has not tried anything for the pain. She feels like she is unable to get comfortable.   The history is provided by the patient.    Past Medical History  Diagnosis Date  . PONV (postoperative nausea and vomiting)   . History of blood transfusion 2006    [facility]  . Anemia   . Endometriosis     CAUSING PELVIC PAIN AND CRAMPING "LIKE CONTRACTIONS" - PREVIOUS TUBAL LIGATION AND ENDOMETRIAL ABLATION ( MARCH 2014) - NOT HAVING MENSTRUAL PERIODS   Past Surgical History  Procedure Laterality Date  . Tubal ligation    . Dilation and curettage of uterus      ablation  . Diagnostic laparoscopy      ectopic, right salpingectomy  . Hysteroscopy N/A 03/02/2013    Procedure: HYSTEROSCOPY;  Surgeon: Brock Bad, MD;  Location: [facility];  Service: Gynecology;  Laterality: N/A;  . Robotic assisted total hysterectomy N/A 07/06/2013    Procedure: ROBOTIC ASSISTED TOTAL HYSTERECTOMY;  Surgeon: Antionette Char, MD;  Location: [facility];  Service: Gynecology;  Laterality: N/A;  . Salpingoophorectomy Left 07/06/2013    Procedure: SALPINGO OOPHORECTOMY;  Surgeon: Antionette Char, MD;  Location: [facility];  Service: Gynecology;  Laterality: Left;   No family history on file. History  Substance Use Topics  . Smoking status: Never Smoker   . Smokeless tobacco: Never Used  . Alcohol Use: Yes     Comment: social   OB History   Grav Para Term Preterm Abortions TAB SAB Ect  Mult Living                 Review of Systems  Gastrointestinal: Positive for abdominal pain. Negative for nausea, vomiting and diarrhea.  Genitourinary: Positive for vaginal bleeding, vaginal pain and pelvic pain. Negative for vaginal discharge.  All other systems reviewed and are negative.    Allergies  Kiwi extract  Home Medications   Current Outpatient Rx  Name  Route  Sig  Dispense  Refill  . cyclobenzaprine (FLEXERIL) 10 MG tablet   Oral   Take 10 mg by mouth 3 (three) times daily as needed for muscle spasms.         Marland Kitchen ibuprofen (ADVIL,MOTRIN) 800 MG tablet   Oral   Take 1 tablet (800 mg total) by mouth every 8 (eight) hours as needed for pain.   30 tablet   5   . Multiple Vitamins-Minerals (MULTIVITAMIN WITH MINERALS) tablet   Oral   Take 1 tablet by mouth daily.          BP 130/84  Pulse 94  Temp(Src) 98.8 F (37.1 C) (Oral)  Resp 22  SpO2 100% Physical Exam  Nursing note and vitals reviewed. Constitutional: She is oriented to person, place, and time. She appears well-developed and well-nourished.  Rolling in bed, in pain  HENT:  Head: Normocephalic and atraumatic.  Right Ear: External ear normal.  Left Ear: External ear normal.  Nose: Nose normal.  Eyes: Right eye  exhibits no discharge. Left eye exhibits no discharge.  Cardiovascular: Normal rate, regular rhythm and normal heart sounds.   Pulmonary/Chest: Effort normal and breath sounds normal.  Abdominal: Soft. There is tenderness in the suprapubic area.  Genitourinary: There is tenderness and bleeding around the vagina. No signs of injury around the vagina.  Neurological: She is alert and oriented to person, place, and time.  Skin: Skin is warm and dry.    ED Course  Procedures (including critical care time) Labs Review Labs Reviewed  CBC WITH DIFFERENTIAL - Abnormal; Notable for the following:    RBC 3.78 (*)    HCT 35.0 (*)    Neutrophils Relative % 32 (*)    Lymphocytes Relative 56  (*)    All other components within normal limits  COMPREHENSIVE METABOLIC PANEL - Abnormal; Notable for the following:    Glucose, Bld 125 (*)    Alkaline Phosphatase 25 (*)    All other components within normal limits   Imaging Review Dg Abd 1 View  08/10/2013   *RADIOLOGY REPORT*  Clinical Data: Vaginal bleeding and diffuse abdominal pain.  ABDOMEN - 1 VIEW  Comparison: None.  Findings: Large volume formed stool, with colonic segments distended the left upper quadrant.  No overt obstruction.  No abnormal intra-abdominal mass effect or suspicious calcification. No acute osseous findings.  IMPRESSION: Large stool volume.  Negative for bowel obstruction.   Original Report Authenticated By: Tiburcio Pea   US Pelvis Complete  08/10/2013   *RADIOLOGY REPORT*  Clinical Data: Vaginal bleeding status post recent hysterectomy.  TRANSABDOMINAL ULTRASOUND OF PELVIS  Technique:  Transabdominal ultrasound examination of the pelvis was performed including evaluation of the uterus, ovaries, adnexal regions, and pelvic cul-de-sac.  Comparison:  01/02/2009.  Findings:  Uterus:  Surgically absent.  Unremarkable vaginal cuff, which is mildly thicker than the lower vaginal wall, not unexpected.  Fluid present throughout the vaginal canal.  No transvaginal imaging performed given recent surgery and adequate visualization.  Right ovary: Not seen.  Left ovary: Surgically absent.  Other Findings:  No free fluid  IMPRESSION:  1.  Unremarkable vaginal cuff.  Hemorrhage present within the vaginal canal. 2.  No pelvic peritoneal fluid. 3.  Remaining right ovary not seen.   Original Report Authenticated By: Tiburcio Pea    MDM   1. Vaginal bleeding    HDS, HR normalized with pain control and mild fluids. No free air on Xray, and u/s shows no cuff laceration. There was blood in vaginal vault but unable to get clear view due to patient discomfort. D/w Dr. Tamela Oddi, who recommends transfer to [facility] so she can  eval. D/w patient, will transfer as she is stable and needs specialized care by Gyn.    Audree Camel, MD 08/10/13 [id number]

## 2013-08-13 ENCOUNTER — Encounter (HOSPITAL_COMMUNITY): Payer: Self-pay | Admitting: Obstetrics & Gynecology

## 2013-08-22 ENCOUNTER — Ambulatory Visit (INDEPENDENT_AMBULATORY_CARE_PROVIDER_SITE_OTHER): Admitting: Obstetrics & Gynecology

## 2013-08-22 ENCOUNTER — Ambulatory Visit: Admitting: Obstetrics

## 2013-08-22 ENCOUNTER — Encounter: Payer: Self-pay | Admitting: Obstetrics & Gynecology

## 2013-08-22 VITALS — Temp 98.6°F | Ht 63.0 in | Wt 140.2 lb

## 2013-08-22 DIAGNOSIS — Z09 Encounter for follow-up examination after completed treatment for conditions other than malignant neoplasm: Secondary | ICD-10-CM

## 2013-08-22 DIAGNOSIS — N760 Acute vaginitis: Secondary | ICD-10-CM

## 2013-08-22 DIAGNOSIS — Z23 Encounter for immunization: Secondary | ICD-10-CM

## 2013-08-22 NOTE — Patient Instructions (Signed)
Influenza Vaccine (Flu Vaccine, Inactivated) 2013 2014 What You Need to Know WHY GET VACCINATED?  Influenza ("flu") is a contagious disease that spreads around the [place] every winter, usually between October and May.  Flu is caused by the influenza virus, and can be spread by coughing, sneezing, and close contact.  Anyone can get flu, but the risk of getting flu is highest among children. Symptoms come on suddenly and may last several days. They can include:  Fever or chills.  Sore throat.  Muscle aches.  Fatigue.  Cough.  Headache.  Runny or stuffy nose. Flu can make some people much sicker than others. These people include young children, people 65 and older, pregnant women, and people with certain health conditions such as heart, lung or kidney disease, or a weakened immune system. Flu vaccine is especially important for these people, and anyone in close contact with them. Flu can also lead to pneumonia, and make existing medical conditions worse. It can cause diarrhea and seizures in children. Each year thousands of people in the [place] die from flu, and many more are hospitalized. Flu vaccine is the best protection we have from flu and its complications. Flu vaccine also helps prevent spreading flu from person to person. INACTIVATED FLU VACCINE There are 2 types of influenza vaccine:  You are getting an inactivated flu vaccine, which does not contain any live influenza virus. It is given by injection with a needle, and often called the "flu shot."  A different live, attenuated (weakened) influenza vaccine is sprayed into the nostrils. This vaccine is described in a separate Vaccine Information Statement. Flu vaccine is recommended every year. Children 6 months through 8 years of age should get 2 doses the first year they get vaccinated. Flu viruses are always changing. Each year's flu vaccine is made to protect from viruses that are most likely to cause disease  that year. While flu vaccine cannot prevent all cases of flu, it is our best defense against the disease. Inactivated flu vaccine protects against 3 or 4 different influenza viruses. It takes about 2 weeks for protection to develop after the vaccination, and protection lasts several months to a year. Some illnesses that are not caused by influenza virus are often mistaken for flu. Flu vaccine will not prevent these illnesses. It can only prevent influenza. A "high-dose" flu vaccine is available for people 65 years of age and older. The person giving you the vaccine can tell you more about it. Some inactivated flu vaccine contains a very small amount of a mercury-based preservative called thimerosal. Studies have shown that thimerosal in vaccines is not harmful, but flu vaccines that do not contain a preservative are available. SOME PEOPLE SHOULD NOT GET THIS VACCINE Tell the person who gives you the vaccine:  If you have any severe (life-threatening) allergies. If you ever had a life-threatening allergic reaction after a dose of flu vaccine, or have a severe allergy to any part of this vaccine, you may be advised not to get a dose. Most, but not all, types of flu vaccine contain a small amount of egg.  If you ever had Guillain Barr Syndrome (a severe paralyzing illness, also called GBS). Some people with a history of GBS should not get this vaccine. This should be discussed with your doctor.  If you are not feeling well. They might suggest waiting until you feel better. But you should come back. RISKS OF A VACCINE REACTION With a vaccine, like any medicine, there   is a chance of side effects. These are usually mild and go away on their own. Serious side effects are also possible, but are very rare. Inactivated flu vaccine does not contain live flu virus, sogetting flu from this vaccine is not possible. Brief fainting spells and related symptoms (such as jerking movements) can happen after any medical  procedure, including vaccination. Sitting or lying down for about 15 minutes after a vaccination can help prevent fainting and injuries caused by falls. Tell your doctor if you feel dizzy or lightheaded, or have vision changes or ringing in the ears. Mild problems following inactivated flu vaccine:  Soreness, redness, or swelling where the shot was given.  Hoarseness; sore, red or itchy eyes; or cough.  Fever.  Aches.  Headache.  Itching.  Fatigue. If these problems occur, they usually begin soon after the shot and last 1 or 2 days. Moderate problems following inactivated flu vaccine:  Young children who get inactivated flu vaccine and pneumococcal vaccine (PCV13) at the same time may be at increased risk for seizures caused by fever. Ask your doctor for more information. Tell your doctor if a child who is getting flu vaccine has ever had a seizure. Severe problems following inactivated flu vaccine:  A severe allergic reaction could occur after any vaccine (estimated less than 1 in a million doses).  There is a small possibility that inactivated flu vaccine could be associated with Guillan Barr Syndrome (GBS), no more than 1 or 2 cases per million people vaccinated. This is much lower than the risk of severe complications from flu, which can be prevented by flu vaccine. The safety of vaccines is always being monitored. For more information, visit: www.cdc.gov/vaccinesafety/ WHAT IF THERE IS A SERIOUS REACTION? What should I look for?  Look for anything that concerns you, such as signs of a severe allergic reaction, very high fever, or behavior changes. Signs of a severe allergic reaction can include hives, swelling of the face and throat, difficulty breathing, a fast heartbeat, dizziness, and weakness. These would start a few minutes to a few hours after the vaccination. What should I do?  If you think it is a severe allergic reaction or other emergency that cannot wait, call 9 1 1  or get the person to the nearest hospital. Otherwise, call your doctor.  Afterward, the reaction should be reported to the Vaccine Adverse Event Reporting System (VAERS). Your doctor might file this report, or you can do it yourself through the VAERS website at www.vaers.hhs.gov, or by calling 1-800-822-7967. VAERS is only for reporting reactions. They do not give medical advice. THE NATIONAL VACCINE INJURY COMPENSATION PROGRAM The National Vaccine Injury Compensation Program (VICP) is a federal program that was created to compensate people who may have been injured by certain vaccines. Persons who believe they may have been injured by a vaccine can learn about the program and about filing a claim by calling 1-800-338-2382 or visiting the VICP website at www.hrsa.gov/vaccinecompensation HOW CAN I LEARN MORE?  Ask your doctor.  Call your local or state health department.  Contact the Centers for Disease Control and Prevention (CDC):  Call 1-800-232-4636 (1-800-CDC-INFO) or  Visit CDC's website at www.cdc.gov/flu CDC Inactivated Influenza Vaccine Interim VIS (06/30/12) Document Released: 09/16/2006 Document Revised: 08/16/2012 Document Reviewed: 06/30/2012 [employer] Patient Information 2014 [employer].  

## 2013-08-22 NOTE — Addendum Note (Signed)
Addended by: Glendell Docker on: 08/22/2013 04:26 PM   Modules accepted: Orders

## 2013-08-22 NOTE — Progress Notes (Signed)
.   Subjective:     Andrea Waters is a 32 y.o. female who presents to the clinic 6 weeks status post Robotic hysterectomy for pelvic pain. 2 weeks s/p repair of a vaginal cuff rupture after intercourse.  Eating a regular diet without difficulty. Bowel movements are normal. The patient is not having any pain.    The following portions of the patient's history were reviewed and updated as appropriate: allergies, current medications, past family history, past medical history, past social history, past surgical history and problem list.  Review of Systems Pertinent items are noted in HPI.    Objective:    Temp(Src) 98.6 F (37 C) (Oral)  Ht 5\' 3"  (1.6 m)  Wt 140 lb 3.2 oz (63.594 kg)  BMI 24.84 kg/m2 General:  alert  Abdomen: soft, bowel sounds active, non-tender  Incision:   healing well, no drainage, no erythema, no hernia, no seroma, no swelling, no dehiscence, incision well approximated    SPEC: copious yellow discharge; cuff appears intact Assessment:    Doing well postoperatively. Operative findings again reviewed. Pathology report discussed.    Plan:    Continue any current medications. Activity restrictions: no lifting more than 5 pounds.  No intercourse Follow up: 4 weeks

## 2013-08-23 LAB — WET PREP BY MOLECULAR PROBE: Candida species: NEGATIVE

## 2013-08-24 ENCOUNTER — Encounter: Payer: Self-pay | Admitting: Obstetrics & Gynecology

## 2013-09-18 ENCOUNTER — Encounter: Payer: Self-pay | Admitting: Obstetrics

## 2013-09-19 ENCOUNTER — Encounter: Admitting: Obstetrics & Gynecology

## 2014-08-23 ENCOUNTER — Ambulatory Visit: Payer: Self-pay | Admitting: Orthopedic Surgery

## 2014-08-28 ENCOUNTER — Ambulatory Visit: Attending: Obstetrics and Gynecology | Admitting: Physical Therapy

## 2014-08-28 DIAGNOSIS — M629 Disorder of muscle, unspecified: Secondary | ICD-10-CM | POA: Insufficient documentation

## 2014-08-28 DIAGNOSIS — M2420 Disorder of ligament, unspecified site: Secondary | ICD-10-CM | POA: Insufficient documentation

## 2014-08-28 DIAGNOSIS — IMO0001 Reserved for inherently not codable concepts without codable children: Secondary | ICD-10-CM | POA: Diagnosis not present

## 2014-09-02 ENCOUNTER — Ambulatory Visit: Admitting: Physical Therapy

## 2014-09-02 DIAGNOSIS — IMO0001 Reserved for inherently not codable concepts without codable children: Secondary | ICD-10-CM | POA: Diagnosis not present

## 2014-09-04 ENCOUNTER — Ambulatory Visit: Admitting: Physical Therapy

## 2014-09-04 DIAGNOSIS — IMO0001 Reserved for inherently not codable concepts without codable children: Secondary | ICD-10-CM | POA: Diagnosis not present

## 2014-09-09 ENCOUNTER — Ambulatory Visit: Attending: Obstetrics and Gynecology | Admitting: Physical Therapy

## 2014-09-09 DIAGNOSIS — Z5189 Encounter for other specified aftercare: Secondary | ICD-10-CM | POA: Insufficient documentation

## 2014-09-09 DIAGNOSIS — M6289 Other specified disorders of muscle: Secondary | ICD-10-CM | POA: Insufficient documentation

## 2014-09-11 ENCOUNTER — Ambulatory Visit: Admitting: Physical Therapy

## 2014-09-12 ENCOUNTER — Encounter: Admitting: Physical Therapy

## 2014-09-19 ENCOUNTER — Ambulatory Visit: Admitting: Physical Therapy

## 2014-09-19 ENCOUNTER — Encounter: Admitting: Physical Therapy

## 2014-09-19 DIAGNOSIS — Z5189 Encounter for other specified aftercare: Secondary | ICD-10-CM | POA: Diagnosis not present

## 2014-09-26 ENCOUNTER — Ambulatory Visit: Admitting: Physical Therapy

## 2014-10-03 ENCOUNTER — Ambulatory Visit: Admitting: Physical Therapy

## 2014-10-07 ENCOUNTER — Encounter: Payer: Self-pay | Admitting: Obstetrics & Gynecology

## 2014-12-02 ENCOUNTER — Encounter: Payer: Self-pay | Admitting: *Deleted

## 2014-12-03 ENCOUNTER — Encounter: Payer: Self-pay | Admitting: Obstetrics & Gynecology

## 2016-01-13 ENCOUNTER — Ambulatory Visit (INDEPENDENT_AMBULATORY_CARE_PROVIDER_SITE_OTHER): Payer: BLUE CROSS/BLUE SHIELD | Admitting: Allergy and Immunology

## 2016-01-13 ENCOUNTER — Encounter: Payer: Self-pay | Admitting: Allergy and Immunology

## 2016-01-13 VITALS — BP 120/86 | HR 80 | Temp 98.4°F | Resp 16 | Ht 63.98 in | Wt 152.3 lb

## 2016-01-13 DIAGNOSIS — J4520 Mild intermittent asthma, uncomplicated: Secondary | ICD-10-CM | POA: Diagnosis not present

## 2016-01-13 DIAGNOSIS — H1010 Acute atopic conjunctivitis, unspecified eye: Secondary | ICD-10-CM | POA: Diagnosis not present

## 2016-01-13 DIAGNOSIS — J309 Allergic rhinitis, unspecified: Secondary | ICD-10-CM

## 2016-01-13 DIAGNOSIS — Z91018 Allergy to other foods: Secondary | ICD-10-CM

## 2016-01-13 MED ORDER — EPINEPHRINE 0.3 MG/0.3ML IJ SOAJ: INTRAMUSCULAR | Status: DC

## 2016-01-13 MED ORDER — ALBUTEROL SULFATE 108 (90 BASE) MCG/ACT IN AEPB: 2.0000 | INHALATION_SPRAY | RESPIRATORY_TRACT | Status: DC | PRN

## 2016-01-13 MED ORDER — FLUTICASONE PROPIONATE HFA 110 MCG/ACT IN AERO: INHALATION_SPRAY | RESPIRATORY_TRACT | Status: AC

## 2016-01-13 MED ORDER — BUDESONIDE 32 MCG/ACT NA SUSP: NASAL | Status: DC

## 2016-01-13 NOTE — Patient Instructions (Addendum)
  1. Allergen avoidance measures  2. OTC Rhinocort one spray each nostril 3-7 times per week. Takes days to work  3. If needed:   A. over-the-counter antihistamine-Claritin/Zyrtec/Allegra  B. pro-air respiclick 2 inhalations every 4-6 hours  C. EpiPen  4. "Action plan" for asthma flare up:   A. Flovent 110 - 3 inhalations 3 times per day with spacer  B. use pro-air if needed  5. Start a course of immunotherapy  6. Return to clinic summer 2017 or earlier if problem

## 2016-01-13 NOTE — Progress Notes (Signed)
Follow-up Note  Referring Provider: Delorse Lek, MD Primary Provider: Delorse Lek, MD Date of Office Visit: 01/13/2016  Subjective:   Andrea Waters (DOB: July 28, 1981) is a 35 y.o. female who [facility] in re-evaluation of the following:  HPI Comments: Samentha returns to this clinic in evaluation of her allergic disease. I last saw her in his clinic in May 2014 at which time she started a course of immunotherapy directed against aeroallergens. Unfortunately, because of logistical issues she could not continue that form of therapy and her last injection was in 2015.  She continues to have problems with her upper airways in the form of nasal congestion and sneezing and occasional itchy watery eyes especially following exposure to the outdoors or pollen or dust or animals.. She will use Benadryl several times per week to treat her symptoms. She also appears to develop recurrent episodes of sinusitis about 3 times in 2016 requiring the administration of antibiotic manifested as very significant nasal congestion and coughing and green postnasal drip and green sputum production. Interestingly, she does develop problems with wheezing as well during these episodes. She will also developed wheezing and coughing if she is exposed to [place] pigs. Does not have any exercise-induced bronchospastic symptoms or cold air induced bronchospastic symptoms. Marland Kitchen  Her husband introduced to dog inside the household 2 weeks ago. She is already developed a problem with contact urticaria when being exposed to the dog and she thinks that her eyes and nose have been somewhat more active since the dog is been introduced inside the household.   Current outpatient prescriptions:  .  cyclobenzaprine (FLEXERIL) 10 MG tablet, Take 10 mg by mouth 3 (three) times daily as needed for muscle spasms., Disp: , Rfl:  .  DiphenhydrAMINE HCl (BENADRYL PO), Take by mouth as  needed., Disp: , Rfl:  .  Multiple Vitamins-Minerals (MULTIVITAMIN WITH MINERALS) tablet, Take 1 tablet by mouth daily., Disp: , Rfl:  .  norethindrone (CAMILA) 0.35 MG tablet, Take 1 tablet by mouth daily. , Disp: , Rfl:  .  ibuprofen (ADVIL,MOTRIN) 800 MG tablet, Take 1 tablet (800 mg total) by mouth every 8 (eight) hours as needed for pain. (Patient not taking: Reported on 01/13/2016), Disp: 30 tablet, Rfl: 5  No orders of the defined types were placed in this encounter.    Past Medical History  Diagnosis Date  . PONV (postoperative nausea and vomiting)   . History of blood transfusion 2006    [facility]  . Anemia   . Endometriosis     CAUSING PELVIC PAIN AND CRAMPING "LIKE CONTRACTIONS" - PREVIOUS TUBAL LIGATION AND ENDOMETRIAL ABLATION ( MARCH 2014) - NOT HAVING MENSTRUAL PERIODS    Past Surgical History  Procedure Laterality Date  . Tubal ligation    . Dilation and curettage of uterus      ablation  . Diagnostic laparoscopy      ectopic, right salpingectomy  . Hysteroscopy N/A 03/02/2013    Procedure: HYSTEROSCOPY;  Surgeon: Brock Bad, MD;  Location: [facility];  Service: Gynecology;  Laterality: N/A;  . Robotic assisted total hysterectomy N/A 07/06/2013    Procedure: ROBOTIC ASSISTED TOTAL HYSTERECTOMY;  Surgeon: Antionette Char, MD;  Location: [facility];  Service: Gynecology;  Laterality: N/A;  . Salpingoophorectomy Left 07/06/2013    Procedure: SALPINGO OOPHORECTOMY;  Surgeon: Antionette Char, MD;  Location: [facility];  Service: Gynecology;  Laterality: Left;  . Anterior and posterior repair  N/A 08/10/2013    Procedure: VAGINAL CUFF REPAIR;  Surgeon: Antionette Char, MD;  Location: [facility];  Service: Gynecology;  Laterality: N/A;    Allergies  Allergen Reactions  . Kiwi Extract Itching and Swelling    Tongue swells and throat itches     Review of systems negative except as noted in HPI / PMHx or noted below:  Review of Systems  Constitutional:  Negative.   HENT: Negative.   Eyes: Negative.   Respiratory: Negative.   Cardiovascular: Negative.   Gastrointestinal: Negative.   Genitourinary: Negative.   Musculoskeletal: Negative.   Skin: Positive for itching (Long-standing history of itching evaluated 2014 with negative antimitochondrial antibody titer).  Neurological: Negative.   Endo/Heme/Allergies: Negative.   Psychiatric/Behavioral: Negative.      Objective:   Filed Vitals:   01/13/16 0826  BP: 120/86  Pulse: 80  Temp: 98.4 F (36.9 C)  Resp: 16   Height: 5' 3.98" (162.5 cm)  Weight: 152 lb 5.4 oz (69.1 kg)   Physical Exam  Constitutional: She is well-developed, well-nourished, and in no distress.  HENT:  Head: Normocephalic.  Right Ear: Tympanic membrane, external ear and ear canal normal.  Left Ear: Tympanic membrane, external ear and ear canal normal.  Nose: Nose normal. No mucosal edema or rhinorrhea.  Mouth/Throat: Uvula is midline, oropharynx is clear and moist and mucous membranes are normal. No oropharyngeal exudate.  Eyes: Conjunctivae are normal.  Neck: Trachea normal. No tracheal tenderness present. No tracheal deviation present. No thyromegaly present.  Cardiovascular: Normal rate, regular rhythm, S1 normal, S2 normal and normal heart sounds.   No murmur heard. Pulmonary/Chest: Breath sounds normal. No stridor. No respiratory distress. She has no wheezes. She has no rales.  Musculoskeletal: She exhibits no edema.  Lymphadenopathy:       Head (right side): No tonsillar adenopathy present.       Head (left side): No tonsillar adenopathy present.    She has no cervical adenopathy.    She has no axillary adenopathy.  Neurological: She is alert. Gait normal.  Skin: No rash noted. She is not diaphoretic. No erythema. Nails show no clubbing.  Psychiatric: Mood and affect normal.    Diagnostics:    Spirometry was performed and demonstrated an FEV1 of 2.71 at 102 % of predicted.  The patient had an  Asthma Control Test with the following results:  .    Assessment and Plan:   1. Asthma, mild intermittent, well-controlled   2. Allergic rhinoconjunctivitis   3. Food allergy     1. Allergen avoidance measures  2. OTC Rhinocort one spray each nostril 3-7 times per week. Takes days to work  3. If needed:   A. over-the-counter antihistamine-Claritin/Zyrtec/Allegra  B. pro-air respiclick 2 inhalations every 4-6 hours  C. EpiPen  4. "Action plan" for asthma flare up:   A. Flovent 110 - 3 inhalations 3 times per day with spacer  B. use pro-air if needed  5. Start a course of immunotherapy  6. Return to clinic summer 2017 or earlier if problem   it does appear as though Jone still continues to have rather significant atopic disease affecting her upper airways and eyes and interestingly also appears to be affecting her lower airway.  She appears to have developed intermittent asthma. She has failed medical therapy in the past. We'll have her utilize the plan mentioned above and start her back on a course immunotherapy and see how she does over the course of the next 6  months or so. She'll contact me during the spring should she have a significant problem.  Laurette Schimke, MD [facility]

## 2016-01-14 ENCOUNTER — Telehealth: Payer: Self-pay

## 2016-01-14 ENCOUNTER — Other Ambulatory Visit: Payer: Self-pay | Admitting: Allergy and Immunology

## 2016-01-14 DIAGNOSIS — H1010 Acute atopic conjunctivitis, unspecified eye: Secondary | ICD-10-CM

## 2016-01-14 DIAGNOSIS — J309 Allergic rhinitis, unspecified: Principal | ICD-10-CM

## 2016-01-14 NOTE — Telephone Encounter (Signed)
Advised patient to use her Rhinocort and she could add a nasal saline to help. Also advised patient that she could add an antihistamine if needed.

## 2016-01-14 NOTE — Telephone Encounter (Signed)
Patient was seen on yesterday . She is having runny, stuffy,Itchy nose. She is wondering is there something that can be called in or over the counter she can take.   [facility] [address] Please Advise

## 2016-01-15 DIAGNOSIS — J301 Allergic rhinitis due to pollen: Secondary | ICD-10-CM | POA: Diagnosis not present

## 2016-01-16 DIAGNOSIS — J3089 Other allergic rhinitis: Secondary | ICD-10-CM | POA: Diagnosis not present

## 2016-02-27 ENCOUNTER — Ambulatory Visit (INDEPENDENT_AMBULATORY_CARE_PROVIDER_SITE_OTHER): Payer: BLUE CROSS/BLUE SHIELD

## 2016-02-27 DIAGNOSIS — J309 Allergic rhinitis, unspecified: Secondary | ICD-10-CM | POA: Diagnosis not present

## 2016-02-27 NOTE — Progress Notes (Signed)
PATIENT IN TO START INJECTIONS.  BLUE 1:100,000(CAT-DOG-TREE-GRASS AND DMITE-WEED) TO FOLLOW SCH. B 1-2 TIMES WEEKLY.  CONSENT SIGNED, INSTRUCTIONS GIVEN.  SHE WILL PICK EPIPEN UP FROM PHARMACY.* SEE INJECTION RECORD*

## 2016-04-02 ENCOUNTER — Ambulatory Visit (INDEPENDENT_AMBULATORY_CARE_PROVIDER_SITE_OTHER): Payer: BLUE CROSS/BLUE SHIELD | Admitting: *Deleted

## 2016-04-02 DIAGNOSIS — J309 Allergic rhinitis, unspecified: Secondary | ICD-10-CM | POA: Diagnosis not present

## 2017-08-06 DIAGNOSIS — F909 Attention-deficit hyperactivity disorder, unspecified type: Secondary | ICD-10-CM

## 2017-08-06 DIAGNOSIS — F419 Anxiety disorder, unspecified: Secondary | ICD-10-CM

## 2017-08-06 HISTORY — DX: Anxiety disorder, unspecified: F41.9

## 2017-08-06 HISTORY — DX: Attention-deficit hyperactivity disorder, unspecified type: F90.9

## 2017-09-07 ENCOUNTER — Encounter (HOSPITAL_COMMUNITY): Payer: Self-pay

## 2017-09-07 ENCOUNTER — Emergency Department (HOSPITAL_COMMUNITY)

## 2017-09-07 ENCOUNTER — Emergency Department (HOSPITAL_COMMUNITY)
Admission: EM | Admit: 2017-09-07 | Discharge: 2017-09-08 | Disposition: A | Discharge status: Home / Self Care | Attending: Emergency Medicine | Admitting: Emergency Medicine

## 2017-09-07 DIAGNOSIS — Y929 Unspecified place or not applicable: Secondary | ICD-10-CM | POA: Diagnosis not present

## 2017-09-07 DIAGNOSIS — Y999 Unspecified external cause status: Secondary | ICD-10-CM | POA: Diagnosis not present

## 2017-09-07 DIAGNOSIS — Z79899 Other long term (current) drug therapy: Secondary | ICD-10-CM | POA: Insufficient documentation

## 2017-09-07 DIAGNOSIS — S39012A Strain of muscle, fascia and tendon of lower back, initial encounter: Secondary | ICD-10-CM | POA: Insufficient documentation

## 2017-09-07 DIAGNOSIS — S199XXA Unspecified injury of neck, initial encounter: Secondary | ICD-10-CM | POA: Diagnosis present

## 2017-09-07 DIAGNOSIS — Y939 Activity, unspecified: Secondary | ICD-10-CM | POA: Diagnosis not present

## 2017-09-07 DIAGNOSIS — S161XXA Strain of muscle, fascia and tendon at neck level, initial encounter: Secondary | ICD-10-CM | POA: Diagnosis not present

## 2017-09-07 MED ORDER — HYDROCODONE-ACETAMINOPHEN 5-325 MG PO TABS
1.0000 | ORAL_TABLET | Freq: Once | ORAL | Status: AC
  Administered 2017-09-07: 1 via ORAL
  Filled 2017-09-07: qty 1
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
## 2017-09-07 NOTE — ED Notes (Signed)
Bed: [facility] Expected date:  Expected time:  Means of arrival:  Comments: 

## 2017-09-08 MED ORDER — IBUPROFEN 800 MG PO TABS: 800.0000 mg | ORAL_TABLET | Freq: Three times a day (TID) | ORAL | 0 refills | Status: DC | PRN

## 2017-09-08 MED ORDER — METAXALONE 800 MG PO TABS: 800.0000 mg | ORAL_TABLET | Freq: Three times a day (TID) | ORAL | 0 refills | Status: DC

## 2017-09-08 MED ORDER — TRAMADOL HCL 50 MG PO TABS: 50.0000 mg | ORAL_TABLET | Freq: Four times a day (QID) | ORAL | 0 refills | Status: DC | PRN

## 2017-09-08 NOTE — Discharge Instructions (Signed)
Return here as needed.  Follow-up with your primary Dr. for recheck your x-rays show any abnormality.

## 2017-09-09 ENCOUNTER — Encounter (HOSPITAL_COMMUNITY): Payer: Self-pay | Admitting: *Deleted

## 2017-09-09 ENCOUNTER — Emergency Department (HOSPITAL_COMMUNITY)
Admission: EM | Admit: 2017-09-09 | Discharge: 2017-09-09 | Disposition: A | Discharge status: Home / Self Care | Attending: Emergency Medicine | Admitting: Emergency Medicine

## 2017-09-09 DIAGNOSIS — S161XXA Strain of muscle, fascia and tendon at neck level, initial encounter: Secondary | ICD-10-CM | POA: Insufficient documentation

## 2017-09-09 DIAGNOSIS — Z79899 Other long term (current) drug therapy: Secondary | ICD-10-CM | POA: Diagnosis not present

## 2017-09-09 DIAGNOSIS — Y929 Unspecified place or not applicable: Secondary | ICD-10-CM | POA: Diagnosis not present

## 2017-09-09 DIAGNOSIS — Y939 Activity, unspecified: Secondary | ICD-10-CM | POA: Diagnosis not present

## 2017-09-09 DIAGNOSIS — Y999 Unspecified external cause status: Secondary | ICD-10-CM | POA: Diagnosis not present
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
## 2017-09-09 NOTE — Discharge Instructions (Signed)
You have been evaluated for your neck pain. It is likely due to strain of muscles and ligaments causing pain. Continue taking medications previously prescribed.  Follow up with orthopedist as needed. Return if you have any concerns.

## 2017-09-09 NOTE — ED Triage Notes (Signed)
Pt complains of bilateral shoulder pain, worse in her left shoulder. Pt also has decreased sensation/tingling in her left face and left shoulder. Pt's doctor said pt should come to ED to be evaluated for concussion.

## 2018-07-26 ENCOUNTER — Encounter: Payer: Self-pay | Admitting: Gastroenterology

## 2018-07-27 NOTE — Progress Notes (Signed)
New patient paperwork 

## 2018-08-11 ENCOUNTER — Ambulatory Visit: Admitting: Gastroenterology

## 2018-09-07 ENCOUNTER — Other Ambulatory Visit: Payer: Self-pay | Admitting: Gastroenterology

## 2018-09-07 DIAGNOSIS — R1011 Right upper quadrant pain: Secondary | ICD-10-CM

## 2018-09-14 ENCOUNTER — Encounter (HOSPITAL_COMMUNITY)
Admission: RE | Admit: 2018-09-14 | Discharge: 2018-09-14 | Disposition: A | Discharge status: Home / Self Care | Source: Ambulatory Visit | Attending: Gastroenterology | Admitting: Gastroenterology

## 2018-09-14 DIAGNOSIS — R1011 Right upper quadrant pain: Secondary | ICD-10-CM | POA: Insufficient documentation

## 2018-09-14 MED ORDER — TECHNETIUM TC 99M MEBROFENIN IV KIT
5.4000 | PACK | Freq: Once | INTRAVENOUS | Status: AC
  Administered 2018-09-14: 5.4 via INTRAVENOUS

## 2018-10-26 ENCOUNTER — Encounter (HOSPITAL_COMMUNITY): Payer: Self-pay | Admitting: *Deleted

## 2018-10-26 ENCOUNTER — Ambulatory Visit: Payer: Self-pay | Admitting: Surgery

## 2018-10-26 ENCOUNTER — Other Ambulatory Visit: Payer: Self-pay

## 2018-10-26 NOTE — H&P (Signed)
Surgical H&P  CC: abdominal pain, nausea  HPI: Andrea Waters Returns today to discuss laparoscopic cholecystectomy.  I saw her a couple of months ago at which point her history was somewhat atypical and she was actively being treated for a new diagnosis of H. Pylori.  She has completed this treatment and continues to have postprandial epigastric and lower chest pain, associated with nausea.  She did undergo a HIDA scan that showed a normal gallbladder ejection fraction however she did experience pain with oral ensure consumption. She also complains of things feeling like they get stuck in her esophagus although she has recently had an upper endoscopy with no structural issues identified.  She is interested in pursuing laparoscopic cholecystectomy.    Initial visit 08/30/18: This is a very nice 37-year-old woman who is referred for gallbladder polyp. She states that she developed food poisoning with severe diarrhea, cramping abdominal pain, nausea and vomiting. Once she recovered from that she started noticing a lot of epigastric abdominal pain, postprandial bloating. The pain is intermittently sharp for the most part is dull. She seems to also have diffuse abdominal pain along with this. She reports that her typical bowel movements are 2-3 times per week but that she is constipated now.  She underwent upper endoscopy and has been diagnosed with H. pylori for which she is being treated now.  Prior surgeries include laparoscopic ectopic pregnancy surgery, tubal ligation, robotic hysterectomy. She does have one ovary. She has a history of endometriosis.  Underwent a abdominal ultrasound on September 11 that showed a 2 mm gallbladder polyp, no gallstones or inflammation. 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 Allergies  Allergen Reactions  . Kiwi Extract Itching and Swelling    Tongue swells and throat  itches     Past Medical History:  Diagnosis Date  . ADHD (attention deficit hyperactivity disorder) 08/2017  . Anemia   . Anxiety 08/2017  . Endometriosis    CAUSING PELVIC PAIN AND CRAMPING "LIKE CONTRACTIONS" - PREVIOUS TUBAL LIGATION AND ENDOMETRIAL ABLATION ( MARCH 2014) - NOT HAVING MENSTRUAL PERIODS  . History of blood transfusion 2006   [facility]  . PONV (postoperative nausea and vomiting)     Past Surgical History:  Procedure Laterality Date  . ANTERIOR AND POSTERIOR REPAIR N/A 08/10/2013   Procedure: VAGINAL CUFF REPAIR;  Surgeon: Lisa Jackson-Moore, MD;  Location: [facility];  Service: Gynecology;  Laterality: N/A;  . DIAGNOSTIC LAPAROSCOPY     ectopic, right salpingectomy  . DILATION AND CURETTAGE OF UTERUS     ablation  . HYSTEROSCOPY N/A 03/02/2013   Procedure: HYSTEROSCOPY;  Surgeon: Charles A Harper, MD;  Location: [facility];  Service: Gynecology;  Laterality: N/A;  . NASAL SINUS SURGERY    . ROBOTIC ASSISTED TOTAL HYSTERECTOMY N/A 07/06/2013   Procedure: ROBOTIC ASSISTED TOTAL HYSTERECTOMY;  Surgeon: Lisa Jackson-Moore, MD;  Location: [facility];  Service: Gynecology;  Laterality: N/A;  . SALPINGOOPHORECTOMY Left 07/06/2013   Procedure: SALPINGO OOPHORECTOMY;  Surgeon: Lisa Jackson-Moore, MD;  Location: [facility];  Service: Gynecology;  Laterality: Left;  . TUBAL LIGATION      Family History  Problem Relation Age of Onset  . High blood pressure Father   . Diabetes Father   . High blood pressure Maternal Grandmother   . Diabetes Maternal Grandmother   . High blood pressure Maternal Grandfather   . Diabetes Maternal Grandfather     Social History     Socioeconomic History  . Marital status: Married    Spouse name: Not on file  . Number of children: Not on file  . Years of education: Not on file  . Highest education level: Not on file  Occupational History  . Not on file  Social Needs  . Financial resource strain: Not on file  . Food insecurity:     Worry: Not on file    Inability: Not on file  . Transportation needs:    Medical: Not on file    Non-medical: Not on file  Tobacco Use  . Smoking status: Never Smoker  . Smokeless tobacco: Never Used  Substance and Sexual Activity  . Alcohol use: Yes    Comment: social  . Drug use: No  . Sexual activity: Yes    Partners: Male    Birth control/protection: Surgical  Lifestyle  . Physical activity:    Days per week: Not on file    Minutes per session: Not on file  . Stress: Not on file  Relationships  . Social connections:    Talks on phone: Not on file    Gets together: Not on file    Attends religious service: Not on file    Active member of club or organization: Not on file    Attends meetings of clubs or organizations: Not on file    Relationship status: Not on file  Other Topics Concern  . Not on file  Social History Narrative  . Not on file    Current Outpatient Medications on File Prior to Visit  Medication Sig Dispense Refill  . Albuterol Sulfate (PROAIR RESPICLICK) 108 (90 Base) MCG/ACT AEPB Inhale 2 Doses into the lungs as needed (every four to six hours for cough or wheeze.). 1 each 1  . ALPRAZolam (XANAX) 0.5 MG tablet Take 0.5 mg by mouth 2 (two) times daily.    . budesonide (RHINOCORT AQUA) 32 MCG/ACT nasal spray Use one spray in each nostril three to seven times a week to prevent runny nose and congestion. 1 Bottle 0  . cloNIDine HCl (KAPVAY) 0.1 MG TB12 ER tablet Take 0.1 mg by mouth.    . cyclobenzaprine (FLEXERIL) 10 MG tablet Take 10 mg by mouth 3 (three) times daily as needed for muscle spasms.    . DiphenhydrAMINE HCl (BENADRYL PO) Take by mouth as needed.    . diphenoxylate-atropine (LOMOTIL) 2.5-0.025 MG tablet Take 2 tablets by mouth 4 (four) times daily as needed for diarrhea or loose stools.    . EPINEPHrine (EPIPEN 2-PAK) 0.3 mg/0.3 mL IJ SOAJ injection Use as directed for life-threatening allergic reaction. 2 Device 3  . fluticasone (FLOVENT HFA)  110 MCG/ACT inhaler Inhale three puffs three times daily during asthma flare-up.  Rinse, gargle, and spit after use. 1 Inhaler 5  . ibuprofen (ADVIL,MOTRIN) 800 MG tablet Take 1 tablet (800 mg total) by mouth every 8 (eight) hours as needed. 21 tablet 0  . metaxalone (SKELAXIN) 800 MG tablet Take 1 tablet (800 mg total) by mouth 3 (three) times daily. 21 tablet 0  . Multiple Vitamins-Minerals (MULTIVITAMIN WITH MINERALS) tablet Take 1 tablet by mouth daily.    . naproxen (NAPROSYN) 500 MG tablet Take 500 mg by mouth 2 (two) times daily as needed.    . norethindrone (CAMILA) 0.35 MG tablet Take 1 tablet by mouth daily.     . omeprazole (PRILOSEC) 20 MG capsule Take 20 mg by mouth daily.    . traMADol (ULTRAM) 50 MG tablet   Take 1 tablet (50 mg total) by mouth every 6 (six) hours as needed for severe pain. 15 tablet 0   No current facility-administered medications on file prior to visit.     Review of Systems: a complete, 10pt review of systems was completed with pertinent positives and negatives as documented in the HPI  Physical Exam: There were no vitals filed for this visit. Gen: A&Ox3, no distress  Head: normocephalic, atraumatic Eyes: extraocular motions intact, anicteric.  Neck: supple without mass or thyromegaly Chest: unlabored respirations, symmetrical air entry, clear bilaterally   Cardiovascular: RRR with palpable distal pulses, no pedal edema Abdomen: soft, nondistended, nontender. No mass or organomegaly.  Extremities: warm, without edema, no deformities  Neuro: grossly intact Psych: appropriate mood and affect, normal insight  Skin: warm and dry   CBC Latest Ref Rng & Units 08/09/2013 07/07/2013 07/02/2013  WBC 4.0 - 10.5 K/uL 6.6 8.8 4.6  Hemoglobin 12.0 - 15.0 g/dL 12.0 11.3(L) 13.3  Hematocrit 36.0 - 46.0 % 35.0(L) 34.3(L) 41.2  Platelets 150 - 400 K/uL 267 262 373    CMP Latest Ref Rng & Units 08/09/2013 07/07/2013 07/02/2013  Glucose 70 - 99 mg/dL 125(H) 111(H) 89  BUN 6  - 23 mg/dL 13 9 8  Creatinine 0.50 - 1.10 mg/dL 0.68 0.75 0.78  Sodium 135 - 145 mEq/L 139 137 138  Potassium 3.5 - 5.1 mEq/L 3.5 3.7 4.0  Chloride 96 - 112 mEq/L 104 103 103  CO2 19 - 32 mEq/L 25 26 26  Calcium 8.4 - 10.5 mg/dL 9.2 9.3 9.7  Total Protein 6.0 - 8.3 g/dL 7.3 6.4 -  Total Bilirubin 0.3 - 1.2 mg/dL 0.3 0.6 -  Alkaline Phos 39 - 117 U/L 25(L) 17(L) -  AST 0 - 37 U/L 15 15 -  ALT 0 - 35 U/L 12 19 -    No results found for: INR, PROTIME  Imaging: No results found.  A/P: abdominal pain and GI symptoms, she has been treated for H. Pylori with seeming minimal improvement. We discussed proceeding with laparoscopic cholecystectomy. Discussed risks of surgery including bleeding, pain, scarring, intraabdominal injury specifically to the common bile duct and sequelae, conversion to open surgery, blood clot, pneumonia, heart attack, stroke, failure to resolve symptoms, etc/ Questions welcomed and answered. Most significantly in her case we discussed the risk of failure to resolve symptoms as her workup has not identified any thing concrete to suggest the gallbladder as a source of her symptoms, we discussed that on some occasions despite a negative workup patient experienced significant relief following cholecystectomy.  We will get her scheduled as soon as possible.   Moataz Tavis, MD [facility] Pager [phone]  

## 2018-10-26 NOTE — H&P (View-Only) (Signed)
Surgical H&P  CC: abdominal pain, nausea  HPI: Andrea Waters Returns today to discuss laparoscopic cholecystectomy.  I saw her a couple of months ago at which point her history was somewhat atypical and she was actively being treated for a new diagnosis of H. Pylori.  She has completed this treatment and continues to have postprandial epigastric and lower chest pain, associated with nausea.  She did undergo a HIDA scan that showed a normal gallbladder ejection fraction however she did experience pain with oral ensure consumption. She also complains of things feeling like they get stuck in her esophagus although she has recently had an upper endoscopy with no structural issues identified.  She is interested in pursuing laparoscopic cholecystectomy.    Initial visit 08/30/18: This is a very nice 37 year old woman who is referred for gallbladder polyp. She states that she developed food poisoning with severe diarrhea, cramping abdominal pain, nausea and vomiting. Once she recovered from that she started noticing a lot of epigastric abdominal pain, postprandial bloating. The pain is intermittently sharp for the most part is dull. She seems to also have diffuse abdominal pain along with this. She reports that her typical bowel movements are 2-3 times per week but that she is constipated now.  She underwent upper endoscopy and has been diagnosed with H. pylori for which she is being treated now.  Prior surgeries include laparoscopic ectopic pregnancy surgery, tubal ligation, robotic hysterectomy. She does have one ovary. She has a history of endometriosis.  Underwent a abdominal ultrasound on September 11 that showed a 2 mm gallbladder polyp, no gallstones or inflammation. 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 Allergies  Allergen Reactions  . Kiwi Extract Itching and Swelling    Tongue swells and throat  itches     Past Medical History:  Diagnosis Date  . ADHD (attention deficit hyperactivity disorder) 08/2017  . Anemia   . Anxiety 08/2017  . Endometriosis    CAUSING PELVIC PAIN AND CRAMPING "LIKE CONTRACTIONS" - PREVIOUS TUBAL LIGATION AND ENDOMETRIAL ABLATION ( MARCH 2014) - NOT HAVING MENSTRUAL PERIODS  . History of blood transfusion 2006   [facility]  . PONV (postoperative nausea and vomiting)     Past Surgical History:  Procedure Laterality Date  . ANTERIOR AND POSTERIOR REPAIR N/A 08/10/2013   Procedure: VAGINAL CUFF REPAIR;  Surgeon: Antionette Char, MD;  Location: [facility];  Service: Gynecology;  Laterality: N/A;  . DIAGNOSTIC LAPAROSCOPY     ectopic, right salpingectomy  . DILATION AND CURETTAGE OF UTERUS     ablation  . HYSTEROSCOPY N/A 03/02/2013   Procedure: HYSTEROSCOPY;  Surgeon: Brock Bad, MD;  Location: [facility];  Service: Gynecology;  Laterality: N/A;  . NASAL SINUS SURGERY    . ROBOTIC ASSISTED TOTAL HYSTERECTOMY N/A 07/06/2013   Procedure: ROBOTIC ASSISTED TOTAL HYSTERECTOMY;  Surgeon: Antionette Char, MD;  Location: [facility];  Service: Gynecology;  Laterality: N/A;  . SALPINGOOPHORECTOMY Left 07/06/2013   Procedure: SALPINGO OOPHORECTOMY;  Surgeon: Antionette Char, MD;  Location: [facility];  Service: Gynecology;  Laterality: Left;  . TUBAL LIGATION      Family History  Problem Relation Age of Onset  . High blood pressure Father   . Diabetes Father   . High blood pressure Maternal Grandmother   . Diabetes Maternal Grandmother   . High blood pressure Maternal Grandfather   . Diabetes Maternal Grandfather     Social History  Socioeconomic History  . Marital status: Married    Spouse name: Not on file  . Number of children: Not on file  . Years of education: Not on file  . Highest education level: Not on file  Occupational History  . Not on file  Social Needs  . Financial resource strain: Not on file  . Food insecurity:     Worry: Not on file    Inability: Not on file  . Transportation needs:    Medical: Not on file    Non-medical: Not on file  Tobacco Use  . Smoking status: Never Smoker  . Smokeless tobacco: Never Used  Substance and Sexual Activity  . Alcohol use: Yes    Comment: social  . Drug use: No  . Sexual activity: Yes    Partners: Male    Birth control/protection: Surgical  Lifestyle  . Physical activity:    Days per week: Not on file    Minutes per session: Not on file  . Stress: Not on file  Relationships  . Social connections:    Talks on phone: Not on file    Gets together: Not on file    Attends religious service: Not on file    Active member of club or organization: Not on file    Attends meetings of clubs or organizations: Not on file    Relationship status: Not on file  Other Topics Concern  . Not on file  Social History Narrative  . Not on file    Current Outpatient Medications on File Prior to Visit  Medication Sig Dispense Refill  . Albuterol Sulfate (PROAIR RESPICLICK) 108 (90 Base) MCG/ACT AEPB Inhale 2 Doses into the lungs as needed (every four to six hours for cough or wheeze.). 1 each 1  . ALPRAZolam (XANAX) 0.5 MG tablet Take 0.5 mg by mouth 2 (two) times daily.    . budesonide (RHINOCORT AQUA) 32 MCG/ACT nasal spray Use one spray in each nostril three to seven times a week to prevent runny nose and congestion. 1 Bottle 0  . cloNIDine HCl (KAPVAY) 0.1 MG TB12 ER tablet Take 0.1 mg by mouth.    . cyclobenzaprine (FLEXERIL) 10 MG tablet Take 10 mg by mouth 3 (three) times daily as needed for muscle spasms.    . DiphenhydrAMINE HCl (BENADRYL PO) Take by mouth as needed.    . diphenoxylate-atropine (LOMOTIL) 2.5-0.025 MG tablet Take 2 tablets by mouth 4 (four) times daily as needed for diarrhea or loose stools.    Marland Kitchen EPINEPHrine (EPIPEN 2-PAK) 0.3 mg/0.3 mL IJ SOAJ injection Use as directed for life-threatening allergic reaction. 2 Device 3  . fluticasone (FLOVENT HFA)  110 MCG/ACT inhaler Inhale three puffs three times daily during asthma flare-up.  Rinse, gargle, and spit after use. 1 Inhaler 5  . ibuprofen (ADVIL,MOTRIN) 800 MG tablet Take 1 tablet (800 mg total) by mouth every 8 (eight) hours as needed. 21 tablet 0  . metaxalone (SKELAXIN) 800 MG tablet Take 1 tablet (800 mg total) by mouth 3 (three) times daily. 21 tablet 0  . Multiple Vitamins-Minerals (MULTIVITAMIN WITH MINERALS) tablet Take 1 tablet by mouth daily.    . naproxen (NAPROSYN) 500 MG tablet Take 500 mg by mouth 2 (two) times daily as needed.    . norethindrone (CAMILA) 0.35 MG tablet Take 1 tablet by mouth daily.     Marland Kitchen omeprazole (PRILOSEC) 20 MG capsule Take 20 mg by mouth daily.    . traMADol (ULTRAM) 50 MG tablet  Take 1 tablet (50 mg total) by mouth every 6 (six) hours as needed for severe pain. 15 tablet 0   No current facility-administered medications on file prior to visit.     Review of Systems: a complete, 10pt review of systems was completed with pertinent positives and negatives as documented in the HPI  Physical Exam: There were no vitals filed for this visit. Gen: A&Ox3, no distress  Head: normocephalic, atraumatic Eyes: extraocular motions intact, anicteric.  Neck: supple without mass or thyromegaly Chest: unlabored respirations, symmetrical air entry, clear bilaterally   Cardiovascular: RRR with palpable distal pulses, no pedal edema Abdomen: soft, nondistended, nontender. No mass or organomegaly.  Extremities: warm, without edema, no deformities  Neuro: grossly intact Psych: appropriate mood and affect, normal insight  Skin: warm and dry   CBC Latest Ref Rng & Units 08/09/2013 07/07/2013 07/02/2013  WBC 4.0 - 10.5 K/uL 6.6 8.8 4.6  Hemoglobin 12.0 - 15.0 g/dL 16.112.0 11.3(L) 13.3  Hematocrit 36.0 - 46.0 % 35.0(L) 34.3(L) 41.2  Platelets 150 - 400 K/uL 267 262 373    CMP Latest Ref Rng & Units 08/09/2013 07/07/2013 07/02/2013  Glucose 70 - 99 mg/dL 096(E125(H) 454(U111(H) 89  BUN 6  - 23 mg/dL 13 9 8   Creatinine 0.50 - 1.10 mg/dL 9.810.68 1.910.75 4.780.78  Sodium 135 - 145 mEq/L 139 137 138  Potassium 3.5 - 5.1 mEq/L 3.5 3.7 4.0  Chloride 96 - 112 mEq/L 104 103 103  CO2 19 - 32 mEq/L 25 26 26   Calcium 8.4 - 10.5 mg/dL 9.2 9.3 9.7  Total Protein 6.0 - 8.3 g/dL 7.3 6.4 -  Total Bilirubin 0.3 - 1.2 mg/dL 0.3 0.6 -  Alkaline Phos 39 - 117 U/L 25(L) 17(L) -  AST 0 - 37 U/L 15 15 -  ALT 0 - 35 U/L 12 19 -    No results found for: INR, PROTIME  Imaging: No results found.  A/P: abdominal pain and GI symptoms, she has been treated for H. Pylori with seeming minimal improvement. We discussed proceeding with laparoscopic cholecystectomy. Discussed risks of surgery including bleeding, pain, scarring, intraabdominal injury specifically to the common bile duct and sequelae, conversion to open surgery, blood clot, pneumonia, heart attack, stroke, failure to resolve symptoms, etc/ Questions welcomed and answered. Most significantly in her case we discussed the risk of failure to resolve symptoms as her workup has not identified any thing concrete to suggest the gallbladder as a source of her symptoms, we discussed that on some occasions despite a negative workup patient experienced significant relief following cholecystectomy.  We will get her scheduled as soon as possible.   Phylliss Blakeshelsea Alaa Mullally, MD [facility], [place] Pager [phone]

## 2018-10-30 MED ORDER — BUPIVACAINE LIPOSOME 1.3 % IJ SUSP
20.0000 mL | INTRAMUSCULAR | Status: DC
  Filled 2018-10-30: qty 20

## 2018-10-31 ENCOUNTER — Encounter (HOSPITAL_COMMUNITY)
Admission: RE | Disposition: A | Discharge status: Home / Self Care | Payer: Self-pay | Source: Ambulatory Visit | Attending: Surgery

## 2018-10-31 ENCOUNTER — Ambulatory Visit (HOSPITAL_COMMUNITY): Admitting: Anesthesiology

## 2018-10-31 ENCOUNTER — Ambulatory Visit (HOSPITAL_COMMUNITY)
Admission: RE | Admit: 2018-10-31 | Discharge: 2018-10-31 | Disposition: A | Discharge status: Home / Self Care | Source: Ambulatory Visit | Attending: Surgery | Admitting: Surgery

## 2018-10-31 ENCOUNTER — Encounter (HOSPITAL_COMMUNITY): Payer: Self-pay

## 2018-10-31 DIAGNOSIS — K8050 Calculus of bile duct without cholangitis or cholecystitis without obstruction: Secondary | ICD-10-CM | POA: Diagnosis present

## 2018-10-31 DIAGNOSIS — K811 Chronic cholecystitis: Secondary | ICD-10-CM | POA: Diagnosis not present

## 2018-10-31 DIAGNOSIS — K219 Gastro-esophageal reflux disease without esophagitis: Secondary | ICD-10-CM | POA: Insufficient documentation

## 2018-10-31 DIAGNOSIS — Z79899 Other long term (current) drug therapy: Secondary | ICD-10-CM | POA: Insufficient documentation

## 2018-10-31 HISTORY — PX: CHOLECYSTECTOMY: SHX55

## 2018-10-31 HISTORY — DX: Unspecified asthma, uncomplicated: J45.909

## 2018-10-31 HISTORY — DX: Gastro-esophageal reflux disease without esophagitis: K21.9

## 2018-10-31 LAB — COMPREHENSIVE METABOLIC PANEL
ALBUMIN: 4.2 g/dL (ref 3.5–5.0)
ALK PHOS: 22 U/L — AB (ref 38–126)
ALT: 13 U/L (ref 0–44)
AST: 18 U/L (ref 15–41)
Anion gap: 9 (ref 5–15)
BUN: 18 mg/dL (ref 6–20)
CALCIUM: 9 mg/dL (ref 8.9–10.3)
CHLORIDE: 106 mmol/L (ref 98–111)
CO2: 23 mmol/L (ref 22–32)
Creatinine, Ser: 0.79 mg/dL (ref 0.44–1.00)
GFR calc non Af Amer: >60 mL/min (ref >60)
GLUCOSE: 90 mg/dL (ref 70–99)
Potassium: 3.8 mmol/L (ref 3.5–5.1)
SODIUM: 138 mmol/L (ref 135–145)
Total Bilirubin: 1 mg/dL (ref 0.3–1.2)
Total Protein: 7.7 g/dL (ref 6.5–8.1)

## 2018-10-31 LAB — CBC WITH DIFFERENTIAL/PLATELET
Abs Immature Granulocytes: 0 10*3/uL (ref 0.00–0.07)
BASOS PCT: 1 %
Basophils Absolute: 0 10*3/uL (ref 0.0–0.1)
EOS ABS: 0.1 10*3/uL (ref 0.0–0.5)
Eosinophils Relative: 1 %
HCT: 37.5 % (ref 36.0–46.0)
Hemoglobin: 11.7 g/dL — ABNORMAL LOW (ref 12.0–15.0)
IMMATURE GRANULOCYTES: 0 %
Lymphocytes Relative: 34 %
Lymphs Abs: 2 10*3/uL (ref 0.7–4.0)
MCH: 31 pg (ref 26.0–34.0)
MCHC: 31.2 g/dL (ref 30.0–36.0)
MCV: 99.2 fL (ref 80.0–100.0)
MONOS PCT: 8 %
Monocytes Absolute: 0.4 10*3/uL (ref 0.1–1.0)
NEUTROS PCT: 56 %
Neutro Abs: 3.2 10*3/uL (ref 1.7–7.7)
PLATELETS: 310 10*3/uL (ref 150–400)
RBC: 3.78 MIL/uL — ABNORMAL LOW (ref 3.87–5.11)
RDW: 12 % (ref 11.5–15.5)
WBC: 5.8 10*3/uL (ref 4.0–10.5)
nRBC: 0 % (ref 0.0–0.2)

## 2018-10-31 SURGERY — LAPAROSCOPIC CHOLECYSTECTOMY
Anesthesia: General

## 2018-10-31 MED ORDER — OXYCODONE HCL 5 MG/5ML PO SOLN: 5.0000 mg | Freq: Once | ORAL | Status: AC | PRN

## 2018-10-31 MED ORDER — FENTANYL CITRATE (PF) 100 MCG/2ML IJ SOLN
INTRAMUSCULAR | Status: AC
  Filled 2018-10-31: qty 2

## 2018-10-31 MED ORDER — PROMETHAZINE HCL 25 MG/ML IJ SOLN: 6.2500 mg | INTRAMUSCULAR | Status: DC | PRN

## 2018-10-31 MED ORDER — FENTANYL CITRATE (PF) 100 MCG/2ML IJ SOLN
25.0000 ug | INTRAMUSCULAR | Status: DC | PRN
  Administered 2018-10-31 (×3): 50 ug via INTRAVENOUS

## 2018-10-31 MED ORDER — HYDROCODONE-ACETAMINOPHEN 5-325 MG PO TABS: 1.0000 | ORAL_TABLET | Freq: Four times a day (QID) | ORAL | 0 refills | Status: DC | PRN

## 2018-10-31 MED ORDER — MIDAZOLAM HCL 5 MG/5ML IJ SOLN
INTRAMUSCULAR | Status: DC | PRN
  Administered 2018-10-31: 2 mg via INTRAVENOUS

## 2018-10-31 MED ORDER — OXYCODONE HCL 5 MG PO TABS
5.0000 mg | ORAL_TABLET | ORAL | Status: DC | PRN
  Administered 2018-10-31: 5 mg via ORAL

## 2018-10-31 MED ORDER — ONDANSETRON HCL 4 MG/2ML IJ SOLN
INTRAMUSCULAR | Status: DC | PRN
  Administered 2018-10-31: 4 mg via INTRAVENOUS

## 2018-10-31 MED ORDER — ACETAMINOPHEN 500 MG PO TABS
1000.0000 mg | ORAL_TABLET | ORAL | Status: AC
  Administered 2018-10-31: 1000 mg via ORAL
  Filled 2018-10-31: qty 2

## 2018-10-31 MED ORDER — KETOROLAC TROMETHAMINE 30 MG/ML IJ SOLN
INTRAMUSCULAR | Status: AC
  Administered 2018-10-31: 30 mg via INTRAVENOUS
  Filled 2018-10-31: qty 1

## 2018-10-31 MED ORDER — FENTANYL CITRATE (PF) 100 MCG/2ML IJ SOLN
INTRAMUSCULAR | Status: AC
  Administered 2018-10-31: 50 ug via INTRAVENOUS
  Filled 2018-10-31: qty 2

## 2018-10-31 MED ORDER — MIDAZOLAM HCL 2 MG/2ML IJ SOLN
INTRAMUSCULAR | Status: AC
  Filled 2018-10-31: qty 2

## 2018-10-31 MED ORDER — 0.9 % SODIUM CHLORIDE (POUR BTL) OPTIME
TOPICAL | Status: DC | PRN
  Administered 2018-10-31: 1000 mL

## 2018-10-31 MED ORDER — LACTATED RINGERS IR SOLN
Status: DC | PRN
  Administered 2018-10-31: 1000 mL

## 2018-10-31 MED ORDER — GABAPENTIN 300 MG PO CAPS
300.0000 mg | ORAL_CAPSULE | ORAL | Status: AC
  Administered 2018-10-31: 300 mg via ORAL
  Filled 2018-10-31: qty 1

## 2018-10-31 MED ORDER — ROCURONIUM BROMIDE 10 MG/ML (PF) SYRINGE
PREFILLED_SYRINGE | INTRAVENOUS | Status: DC | PRN
  Administered 2018-10-31: 50 mg via INTRAVENOUS

## 2018-10-31 MED ORDER — CEFAZOLIN SODIUM-DEXTROSE 2-4 GM/100ML-% IV SOLN
2.0000 g | INTRAVENOUS | Status: AC
  Administered 2018-10-31: 2 g via INTRAVENOUS
  Filled 2018-10-31: qty 100

## 2018-10-31 MED ORDER — FENTANYL CITRATE (PF) 100 MCG/2ML IJ SOLN
INTRAMUSCULAR | Status: DC | PRN
  Administered 2018-10-31: 100 ug via INTRAVENOUS

## 2018-10-31 MED ORDER — EPHEDRINE SULFATE-NACL 50-0.9 MG/10ML-% IV SOSY
PREFILLED_SYRINGE | INTRAVENOUS | Status: DC | PRN
  Administered 2018-10-31: 10 mg via INTRAVENOUS

## 2018-10-31 MED ORDER — CHLORHEXIDINE GLUCONATE 4 % EX LIQD: 60.0000 mL | Freq: Once | CUTANEOUS | Status: DC

## 2018-10-31 MED ORDER — SUGAMMADEX SODIUM 200 MG/2ML IV SOLN
INTRAVENOUS | Status: DC | PRN
  Administered 2018-10-31: 300 mg via INTRAVENOUS

## 2018-10-31 MED ORDER — BUPIVACAINE-EPINEPHRINE (PF) 0.25% -1:200000 IJ SOLN
INTRAMUSCULAR | Status: AC
  Filled 2018-10-31: qty 30

## 2018-10-31 MED ORDER — LACTATED RINGERS IV SOLN
INTRAVENOUS | Status: DC
  Administered 2018-10-31: 1000 mL via INTRAVENOUS

## 2018-10-31 MED ORDER — OXYCODONE HCL 5 MG PO TABS
ORAL_TABLET | ORAL | Status: AC
  Filled 2018-10-31: qty 1

## 2018-10-31 MED ORDER — BUPIVACAINE-EPINEPHRINE 0.25% -1:200000 IJ SOLN
INTRAMUSCULAR | Status: DC | PRN
  Administered 2018-10-31: 30 mL

## 2018-10-31 MED ORDER — KETOROLAC TROMETHAMINE 30 MG/ML IJ SOLN
30.0000 mg | Freq: Once | INTRAMUSCULAR | Status: AC | PRN
  Administered 2018-10-31: 30 mg via INTRAVENOUS

## 2018-10-31 MED ORDER — OXYCODONE HCL 5 MG PO TABS
5.0000 mg | ORAL_TABLET | Freq: Once | ORAL | Status: AC | PRN
  Administered 2018-10-31: 5 mg via ORAL

## 2018-10-31 MED ORDER — EPHEDRINE 5 MG/ML INJ
INTRAVENOUS | Status: AC
  Filled 2018-10-31: qty 20

## 2018-10-31 MED ORDER — CELECOXIB 200 MG PO CAPS
200.0000 mg | ORAL_CAPSULE | ORAL | Status: AC
  Administered 2018-10-31: 200 mg via ORAL
  Filled 2018-10-31: qty 1

## 2018-10-31 MED ORDER — SUGAMMADEX SODIUM 200 MG/2ML IV SOLN
INTRAVENOUS | Status: AC
  Filled 2018-10-31: qty 4

## 2018-10-31 MED ORDER — DEXAMETHASONE SODIUM PHOSPHATE 10 MG/ML IJ SOLN
INTRAMUSCULAR | Status: DC | PRN
  Administered 2018-10-31: 10 mg via INTRAVENOUS

## 2018-10-31 MED ORDER — PROPOFOL 10 MG/ML IV BOLUS
INTRAVENOUS | Status: DC | PRN
  Administered 2018-10-31: 200 mg via INTRAVENOUS

## 2018-10-31 MED ORDER — PROPOFOL 10 MG/ML IV BOLUS
INTRAVENOUS | Status: AC
  Filled 2018-10-31: qty 20

## 2018-10-31 MED ORDER — LIDOCAINE 2% (20 MG/ML) 5 ML SYRINGE
INTRAMUSCULAR | Status: DC | PRN
  Administered 2018-10-31: 100 mg via INTRAVENOUS

## 2018-10-31 MED ORDER — DOCUSATE SODIUM 100 MG PO CAPS: 100.0000 mg | ORAL_CAPSULE | Freq: Two times a day (BID) | ORAL | 0 refills | Status: AC

## 2018-10-31 SURGICAL SUPPLY — 32 items
APPLIER CLIP ROT 10 11.4 M/L (STAPLE) ×3
CABLE HIGH FREQUENCY MONO STRZ (ELECTRODE) ×3 IMPLANT
CHLORAPREP W/TINT 26ML (MISCELLANEOUS) ×3 IMPLANT
CLIP APPLIE ROT 10 11.4 M/L (STAPLE) ×1 IMPLANT
COVER MAYO STAND STRL (DRAPES) IMPLANT
COVER SURGICAL LIGHT HANDLE (MISCELLANEOUS) ×3 IMPLANT
COVER WAND RF STERILE (DRAPES) ×3 IMPLANT
DECANTER SPIKE VIAL GLASS SM (MISCELLANEOUS) ×3 IMPLANT
DERMABOND ADVANCED (GAUZE/BANDAGES/DRESSINGS) ×2
DERMABOND ADVANCED .7 DNX12 (GAUZE/BANDAGES/DRESSINGS) ×1 IMPLANT
DRAPE C-ARM 42X120 X-RAY (DRAPES) IMPLANT
ELECT REM PT RETURN 15FT ADLT (MISCELLANEOUS) ×3 IMPLANT
GLOVE BIO SURGEON STRL SZ 6 (GLOVE) ×3 IMPLANT
GLOVE INDICATOR 6.5 STRL GRN (GLOVE) ×3 IMPLANT
GOWN STRL REUS W/TWL LRG LVL3 (GOWN DISPOSABLE) ×3 IMPLANT
GOWN STRL REUS W/TWL XL LVL3 (GOWN DISPOSABLE) ×6 IMPLANT
GRASPER SUT TROCAR 14GX15 (MISCELLANEOUS) ×3 IMPLANT
HEMOSTAT SNOW SURGICEL 2X4 (HEMOSTASIS) IMPLANT
KIT BASIN OR (CUSTOM PROCEDURE TRAY) ×3 IMPLANT
NEEDLE INSUFFLATION 14GA 120MM (NEEDLE) ×3 IMPLANT
POUCH SPECIMEN RETRIEVAL 10MM (ENDOMECHANICALS) ×3 IMPLANT
SCISSORS LAP 5X35 DISP (ENDOMECHANICALS) ×3 IMPLANT
SET CHOLANGIOGRAPH MIX (MISCELLANEOUS) IMPLANT
SET IRRIG TUBING LAPAROSCOPIC (IRRIGATION / IRRIGATOR) ×3 IMPLANT
SLEEVE XCEL OPT CAN 5 100 (ENDOMECHANICALS) ×6 IMPLANT
SUT MNCRL AB 4-0 PS2 18 (SUTURE) ×3 IMPLANT
TOWEL OR 17X26 10 PK STRL BLUE (TOWEL DISPOSABLE) ×3 IMPLANT
TOWEL OR NON WOVEN STRL DISP B (DISPOSABLE) IMPLANT
TRAY LAPAROSCOPIC (CUSTOM PROCEDURE TRAY) ×3 IMPLANT
TROCAR BLADELESS OPT 5 100 (ENDOMECHANICALS) ×3 IMPLANT
TROCAR XCEL 12X100 BLDLESS (ENDOMECHANICALS) ×3 IMPLANT
TUBING INSUF HEATED (TUBING) ×3 IMPLANT

## 2018-10-31 NOTE — Transfer of Care (Signed)
Immediate Anesthesia Transfer of Care Note  Patient: Andrea Waters  Procedure(s) Performed: Procedure(s): LAPAROSCOPIC CHOLECYSTECTOMY (N/A)  Patient Location: PACU  Anesthesia Type:General  Level of Consciousness:  sedated, patient cooperative and responds to stimulation  Airway & Oxygen Therapy:Patient Spontanous Breathing and Patient connected to face mask oxgen  Post-op Assessment:  Report given to PACU RN and Post -op Vital signs reviewed and stable  Post vital signs:  Reviewed and stable  Last Vitals:  Vitals:   10/31/18 0726  BP: (!) 132/95  Pulse: 77  Resp: 16  Temp: 37.1 C  SpO2: 100%    Complications: No apparent anesthesia complications

## 2018-10-31 NOTE — Interval H&P Note (Signed)
History and Physical Interval Note:  10/31/2018 8:21 AM  Andrea Waters  has presented today for surgery, with the diagnosis of BILIARY COLIC  The various methods of treatment have been discussed with the patient and family. After consideration of risks, benefits and other options for treatment, the patient has consented to  Procedure(s): LAPAROSCOPIC CHOLECYSTECTOMY (N/A) as a surgical intervention .  The patient's history has been reviewed, patient examined, no change in status, stable for surgery.  I have reviewed the patient's chart and labs.  Questions were answered to the patient's satisfaction.      Lollie SailsA 

## 2018-10-31 NOTE — Op Note (Signed)
Operative Note  Andrea Waters 37 y.o. female [id number]  10/31/2018  Surgeon: Berna Buehelsea A [name] MD  Assistant: none  Procedure performed: Laparoscopic Cholecystectomy  Preop diagnosis: biliary colic Post-op diagnosis/intraop findings: same  Specimens: gallbladder  EBL: minimal  Complications: none  Description of procedure: After obtaining informed consent the patient was brought to the operating room. Prophylactic antibiotics were administered. SCD's were applied. General endotracheal anesthesia was initiated and a formal time-out was performed. The abdomen was prepped and draped in the usual sterile fashion and the abdomen was entered using visiport technique in the left upper quadrant after instilling the site with local. Insufflation to 15mmHg was obtained and gross inspection revealed no evidence of injury from our entry or other intraabdominal abnormalities. Three 5mm trocars were introduced in the infraumbilical, right midclavicular and right anterior axillary lines under direct visualization and following infiltration with local. The entry port was upsized to an 11mm trocar. There were thin omental adhesions to the falciform ligament which were taken down with cautery. There are no other adhesions to the anterior abdominal wall or other gross abnormalities to explain the patient's chronic abdominal pain and nausea. The gallbladder was retracted cephalad and the infundibulum was retracted laterally. A combination of hook electrocautery and blunt dissection was utilized to clear the peritoneum from the neck and cystic duct, circumferentially isolating the cystic artery and cystic duct and lifting the gallbladder from the cystic plate. The critical view of safety was achieved with the cystic artery, cystic duct, and liver bed visualized between them with no other structures. The artery was clipped with a single clip proximally and distally and divided as was the cystic duct with three  clips on the proximal end. The gallbladder was dissected from the liver plate using electrocautery. Once freed the gallbladder was placed in an endocatch bag and removed intact through the epigastric trocar site. Hemostasis was once again confirmed, and reinspection of the abdomen revealed no injuries. The clips were well opposed without any bile leak from the duct or the liver bed. The 11mm trocar site in the epigastrium was closed with a 0 vicryl in the fascia under direct visualization using a PMI device. The abdomen was desufflated and all trocars removed. The skin incisions were closed with running subcuticular monocryl and Dermabond. The patient was awakened, extubated and transported to the recovery room in stable condition.   All counts were correct at the completion of the case.

## 2018-10-31 NOTE — Discharge Instructions (Signed)
LAPAROSCOPIC SURGERY: POST OP INSTRUCTIONS ° °###################################################################### ° °EAT °Gradually transition to a high fiber diet with a fiber supplement over the next few weeks after discharge.  Start with a pureed / full liquid diet (see below) ° °WALK °Walk an hour a day.  Control your pain to do that.   ° °CONTROL PAIN °Control pain so that you can walk, sleep, tolerate sneezing/coughing, go up/down stairs. ° °HAVE A BOWEL MOVEMENT DAILY °Keep your bowels regular to avoid problems.  OK to try a laxative to override constipation.  OK to use an antidairrheal to slow down diarrhea.  Call if not better after 2 tries ° °CALL IF YOU HAVE PROBLEMS/CONCERNS °Call if you are still struggling despite following these instructions. °Call if you have concerns not answered by these instructions ° °###################################################################### ° ° ° °1. DIET: Follow a light bland diet the first 24 hours after arrival home, such as soup, liquids, crackers, etc.  Be sure to include lots of fluids daily.  Avoid fast food or heavy meals as your are more likely to get nauseated.  Eat a low fat the next few days after surgery.   ° °2. Take your usually prescribed home medications unless otherwise directed. ° °3. PAIN CONTROL: °a. Pain is best controlled by a usual combination of three different methods TOGETHER: °i. Ice/Heat °ii. Over the counter pain medication °iii. Prescription pain medication °b. Most patients will experience some swelling and bruising around the incisions.  Ice packs or heating pads (30-60 minutes up to 6 times a day) will help. Use ice for the first few days to help decrease swelling and bruising, then switch to heat to help relax tight/sore spots and speed recovery.  Some people prefer to use ice alone, heat alone, alternating between ice & heat.  Experiment to what works for you.  Swelling and bruising can take several weeks to resolve.   °c. It  is helpful to take an over-the-counter pain medication regularly for the first few weeks.  Choose one of the following that works best for you: °i. Naproxen (Aleve, etc)  Two 220mg tabs twice a day °ii. Ibuprofen (Advil, etc) Three 200mg tabs four times a day (every meal & bedtime) °iii. Acetaminophen (Tylenol, etc) 500-650mg four times a day (every meal & bedtime) °d. A  prescription for pain medication (such as oxycodone, hydrocodone, tramadol, gabapentin, methocarbamol, etc) should be given to you upon discharge.  Take your pain medication as prescribed.  °i. If you are having problems/concerns with the prescription medicine (does not control pain, nausea, vomiting, rash, itching, etc), please call us [phone] to see if we need to switch you to a different pain medicine that will work better for you and/or control your side effect better. °ii. If you need a refill on your pain medication, please give us 48 hour notice.  contact your pharmacy.  They will contact our office to request authorization. Prescriptions will not be filled after 5 pm or on week-ends ° °4. Avoid getting constipated.   °a. Between the surgery and the pain medications, it is common to experience some constipation.   °b. Increasing fluid intake and taking a fiber supplement (such as Metamucil, Citrucel, FiberCon, MiraLax, etc) 1-2 times a day regularly will usually help prevent this problem from occurring.   °c. A mild laxative (prune juice, Milk of Magnesia, MiraLax, etc) should be taken according to package directions if there are no bowel movements after 48 hours.   °5. Watch out for   diarrhea.   a. If you have many loose bowel movements, simplify your diet to bland foods & liquids for a few days.   b. Stop any stool softeners and decrease your fiber supplement.   c. Switching to mild anti-diarrheal medications (Kayopectate, Pepto Bismol) can help.   d. If this worsens or does not improve, please call us.  6. Wash / shower every  day.  You may shower over the skin glue which is waterproof.  Continue to shower over incision(s) after the dressing is off.  7. Skin glue will flake off after 2 weeks.  You may leave the incision open to air.  You may replace a dressing/Band-Aid to cover the incision for comfort if you wish.   8. ACTIVITIES as tolerated:   a. You may resume regular (light) daily activities beginning the next day--such as daily self-care, walking, climbing stairs--gradually increasing activities as tolerated.  If you can walk 30 minutes without difficulty, it is safe to try more intense activity such as jogging, treadmill, bicycling, low-impact aerobics, swimming, etc. b. Save the most intensive and strenuous activity for last such as sit-ups, heavy lifting, contact sports, etc  Refrain from any heavy lifting or straining until you are off narcotics for pain control.   c. DO NOT PUSH THROUGH PAIN.  Let pain be your guide: If it hurts to do something, don't do it.  Pain is your body warning you to avoid that activity for another week until the pain goes down. d. You may drive when you are no longer taking prescription pain medication, you can comfortably wear a seatbelt, and you can safely maneuver your car and apply brakes. e. Bonita Quin may have sexual intercourse when it is comfortable.  9. FOLLOW UP in our office a. Please call [facility] at [phone] to set up an appointment to see your surgeon in the office for a follow-up appointment approximately 2-3 weeks after your surgery. b. Make sure that you call for this appointment the day you arrive home to insure a convenient appointment time.  10. IF YOU HAVE DISABILITY OR FAMILY LEAVE FORMS, BRING THEM TO THE OFFICE FOR PROCESSING.  DO NOT GIVE THEM TO YOUR DOCTOR.   WHEN TO CALL us [phone]: 1. Poor pain control 2. Reactions / problems with new medications (rash/itching, nausea, etc)  3. Fever over 101.5 F (38.5 C) 4. Inability to urinate 5. Nausea and/or  vomiting 6. Worsening swelling or bruising 7. Continued bleeding from incision. 8. Increased pain, redness, or drainage from the incision   The clinic staff is available to answer your questions during regular business hours (8:30am-5pm).  Please dont hesitate to call and ask to speak to one of our nurses for clinical concerns.   If you have a medical emergency, go to the nearest emergency room or call 911.  A surgeon from [facility] is always on call at the [facility], [place] [address], [address], [place], [place]  [address] ? MAIN: [phone] ? TOLL FREE: [phone] ?  FAX [phone] [email or web address]  General Anesthesia, Adult, Care After These instructions provide you with information about caring for yourself after your procedure. Your health care provider may also give you more specific instructions. Your treatment has been planned according to current medical practices, but problems sometimes occur. Call your health care provider if you have any problems or questions after your procedure. What can I expect after the procedure? After the procedure, it  is common to have:  Vomiting.  A sore throat.  Mental slowness.  It is common to feel:  Nauseous.  Cold or shivery.  Sleepy.  Tired.  Sore or achy, even in parts of your body where you did not have surgery.  Follow these instructions at home: For at least 24 hours after the procedure:  Do not: ? Participate in activities where you could fall or become injured. ? Drive. ? Use heavy machinery. ? Drink alcohol. ? Take sleeping pills or medicines that cause drowsiness. ? Make important decisions or sign legal documents. ? Take care of children on your own.  Rest. Eating and drinking  If you vomit, drink water, juice, or soup when you can drink without vomiting.  Drink enough fluid to keep your urine clear or pale yellow.  Make sure you have  little or no nausea before eating solid foods.  Follow the diet recommended by your health care provider. General instructions  Have a responsible adult stay with you until you are awake and alert.  Return to your normal activities as told by your health care provider. Ask your health care provider what activities are safe for you.  Take over-the-counter and prescription medicines only as told by your health care provider.  If you smoke, do not smoke without supervision.  Keep all follow-up visits as told by your health care provider. This is important. Contact a health care provider if:  You continue to have nausea or vomiting at home, and medicines are not helpful.  You cannot drink fluids or start eating again.  You cannot urinate after 8-12 hours.  You develop a skin rash.  You have fever.  You have increasing redness at the site of your procedure. Get help right away if:  You have difficulty breathing.  You have chest pain.  You have unexpected bleeding.  You feel that you are having a life-threatening or urgent problem. This information is not intended to replace advice given to you by your health care provider. Make sure you discuss any questions you have with your health care provider. Document Released: 02/28/2001 Document Revised: 04/26/2016 Document Reviewed: 11/06/2015 Elsevier Interactive Patient Education  [employer]2018 [employer]

## 2018-10-31 NOTE — Anesthesia Procedure Notes (Signed)
Procedure Name: Intubation Date/Time: 10/31/2018 9:09 AM Performed by: Lavina Hamman, CRNA Pre-anesthesia Checklist: Patient identified, Emergency Drugs available, Suction available, Patient being monitored and Timeout performed Patient Re-evaluated:Patient Re-evaluated prior to induction Oxygen Delivery Method: Circle system utilized Preoxygenation: Pre-oxygenation with 100% oxygen Induction Type: IV induction Ventilation: Mask ventilation without difficulty Laryngoscope Size: Mac and 3 Grade View: Grade I Tube type: Oral Tube size: 7.0 mm Number of attempts: 1 Airway Equipment and Method: Stylet Placement Confirmation: ETT inserted through vocal cords under direct vision,  positive ETCO2,  CO2 detector and breath sounds checked- equal and bilateral Secured at: 21 cm Tube secured with: Tape Dental Injury: Teeth and Oropharynx as per pre-operative assessment

## 2018-10-31 NOTE — Anesthesia Preprocedure Evaluation (Signed)
Anesthesia Evaluation  Patient identified by MRN, date of birth, ID band Patient awake    Reviewed: Allergy & Precautions, NPO status , Patient's Chart, lab work & pertinent test results  Airway Mallampati: II  TM Distance: >3 FB Neck ROM: Full    Dental no notable dental hx.    Pulmonary neg pulmonary ROS,    Pulmonary exam normal breath sounds clear to auscultation       Cardiovascular negative cardio ROS Normal cardiovascular exam Rhythm:Regular Rate:Normal     Neuro/Psych negative neurological ROS  negative psych ROS   GI/Hepatic Neg liver ROS, GERD  ,  Endo/Other  negative endocrine ROS  Renal/GU negative Renal ROS  negative genitourinary   Musculoskeletal negative musculoskeletal ROS (+)   Abdominal   Peds negative pediatric ROS (+)  Hematology negative hematology ROS (+)   Anesthesia Other Findings   Reproductive/Obstetrics negative OB ROS                             Anesthesia Physical Anesthesia Plan  ASA: II  Anesthesia Plan: General   Post-op Pain Management:    Induction: Intravenous  PONV Risk Score and Plan: 3 and Ondansetron, Dexamethasone, Treatment may vary due to age or medical condition and Midazolam  Airway Management Planned: Oral ETT  Additional Equipment:   Intra-op Plan:   Post-operative Plan: Extubation in OR  Informed Consent: I have reviewed the patients History and Physical, chart, labs and discussed the procedure including the risks, benefits and alternatives for the proposed anesthesia with the patient or authorized representative who has indicated his/her understanding and acceptance.   Dental advisory given  Plan Discussed with: CRNA and Surgeon  Anesthesia Plan Comments:         Anesthesia Quick Evaluation

## 2018-10-31 NOTE — Anesthesia Postprocedure Evaluation (Signed)
Anesthesia Post Note  Patient: Andrea Waters  Procedure(s) Performed: LAPAROSCOPIC CHOLECYSTECTOMY (N/A )     Patient location during evaluation: PACU Anesthesia Type: General Level of consciousness: awake and alert Pain management: pain level controlled Vital Signs Assessment: post-procedure vital signs reviewed and stable Respiratory status: spontaneous breathing, nonlabored ventilation, respiratory function stable and patient connected to nasal cannula oxygen Cardiovascular status: blood pressure returned to baseline and stable Postop Assessment: no apparent nausea or vomiting Anesthetic complications: no    Last Vitals:  Vitals:   10/31/18 1015 10/31/18 1030  BP: (!) 135/91 (!) 141/91  Pulse: 81 78  Resp: 15 17  Temp:    SpO2: 100% 100%    Last Pain:  Vitals:   10/31/18 1015  PainSc: 7                  Echo Propp S

## 2018-11-01 ENCOUNTER — Encounter (HOSPITAL_COMMUNITY): Payer: Self-pay | Admitting: Surgery

## 2019-05-22 ENCOUNTER — Other Ambulatory Visit: Payer: Self-pay | Admitting: Obstetrics and Gynecology

## 2020-04-29 ENCOUNTER — Other Ambulatory Visit: Payer: Self-pay

## 2020-04-29 ENCOUNTER — Ambulatory Visit (INDEPENDENT_AMBULATORY_CARE_PROVIDER_SITE_OTHER): Admitting: Neurology

## 2020-04-29 ENCOUNTER — Encounter: Payer: Self-pay | Admitting: Neurology

## 2020-04-29 ENCOUNTER — Telehealth: Payer: Self-pay | Admitting: Neurology

## 2020-04-29 VITALS — BP 138/95 | HR 85 | Ht 63.0 in | Wt 133.0 lb

## 2020-04-29 DIAGNOSIS — R202 Paresthesia of skin: Secondary | ICD-10-CM

## 2020-04-29 NOTE — Patient Instructions (Signed)
Magnetic Resonance Imaging Magnetic resonance imaging (MRI) is a painless test that takes pictures of the inside of your body. This test uses a strong magnet. This test does not use X-rays. What happens before the procedure?  You will be asked about any metal you may have in your body. This includes: ? Any joint replacement (prosthesis), such as an artificial knee or hip. ? Any implanted devices or ports. ? A metallic ear implant (cochlear implant). ? An artificial heart valve. ? A metallic object in the eye. ? Metal splinters. ? Bullet fragments. ? Any tattoos. Some of the darker inks can cause problems with testing.  You will be asked to take off all metal. This includes: ? Your watch, jewelry, and other metal objects. ? Hearing aids. ? Dentures. ? Underwire bra. ? Makeup. Some kinds of makeup contain small amounts of metal. ? Braces and fillings normally are not a problem.  If you are breastfeeding, ask your doctor if you need to pump before your test and then stop breastfeeding for a short time. You may need to do this if dye (contrast material) will be used during your MRI.  If you have a fear of cramped spaces (claustrophobia), you may be given medicines prior to the MRI. What happens during the procedure?   You may be given earplugs or headphones to listen to music. The MRI machine can be noisy.  You will lie down on a platform. It looks like a long table.  If dye will be used, an IV tube will be placed into one of your veins. Dye will be given through your IV tube at a certain time as images are taken.  The platform will slide into a tunnel. The tunnel has magnets inside of it. When you are inside the tunnel, you will still be able to talk to your doctor.  The tunnel will scan your body and make images. You will be asked to lie very still. Your doctor will tell you when you can move. You may have to wait a few minutes to make sure that the images from the test are  clear.  When all images are taken, the platform will slide out of the tunnel. The procedure may vary among doctors and hospitals. What happens after the procedure?  You may be taken to a recovery area if sedation medicines were used. Your blood pressure, heart rate, breathing rate, and blood oxygen level will be monitored until you leave the hospital or clinic.  If dye was used: ? It will leave your body through your pee (urine). It takes about a day for all of the dye to leave the body. ? You may be told to drink plenty of fluids. This helps your body get rid of the dye. ? If you are breastfeeding, do not breastfeed your child until your doctor says that this is safe.  You may go back to your normal activities right away, or as told by your doctor.  It is up to you to get your test results. Ask your doctor, or the department that is doing the test, when your results will be ready. Summary  Magnetic resonance imaging (MRI) is a test that takes pictures of the inside of your body.  Before your MRI, be sure to tell your doctor about any metal you may have in your body.  You may go back to your normal activities right away, or as told by your doctor. This information is not intended to replace advice   given to you by your health care provider. Make sure you discuss any questions you have with your health care provider. Document Revised: 10/17/2017 Document Reviewed: 10/17/2017 Elsevier Patient Education  2020 Elsevier Inc.  

## 2020-04-29 NOTE — Telephone Encounter (Signed)
tricare order sent to [facility]. No auth they will reach out to the patient to schedule.  

## 2020-04-29 NOTE — Addendum Note (Signed)
Addended by: Judi Cong on: 04/29/2020 11:41 AM   Modules accepted: Orders

## 2020-04-29 NOTE — Progress Notes (Signed)
Provider:  Melvyn Novas, MD  Referring Provider: Delorse Lek, MD Primary Care Physician:  Delorse Lek, MD  Chief Complaint  Patient presents with  . New Patient (Initial Visit)    pt alone, rm 11. presents today due to over the last year has developed numbness and tingling. it started in the left face and moved to left arm and down to fingertips. states she is starting to note that the left leg knee down to toes is also starting to have the same feeling. the nerve pains were so bothersome that she was started on lyrica which has been helpful.    HPI:  Andrea Waters is a 39 year-old African - American right handed  female and seen here on 04-29-2020 upon a referral/ revisit from [facility] for evaluation of reports facial numbness.   Andrea Waters was referred by the form of [facility] now [facility] [place].  She had complained about numbness and tingling in her left lower face, but also at times affecting both sides of the neck and bilaterally upper extremities.  This may have been present for as long as a year but it seems not to be a constant sensation.  She describes a waxing and waning quality but feels that it is always to some degree present.  She reports no feeling of puffiness , no swelling and no rouble to speak.    She had already seen a spine specialist and had an epidural which made some numbness and tingling worse.  She had been prescribed Lyrica but at the time she saw PA Mayford Knife she had not started it yet.  She had an MRI of the neck already and supposedly this had shown a bulging disc but it was not herniated disc or radiculopathy.  She does not have weakness in her upper extremities she does not have headaches, there is no visible difference to the skin tone, muscle tone no rash or pallor.  Independent of what she is here for today she has been seeing a shoulder specialist orthopedic surgeon who diagnosed her  with a supraspinatus and infraspinatus tendinosis.  There is also a tear in the posterior labrum and mild subdeltoid bursitis.  Her orthopedist had planned a surgery for July 2021 but she is looking also for a second opinion.  She has not made the surgical appointment yet.  By orthopedist she has been on tramadol, Celebrex, prednisone, ibuprofen, Naprosyn, meloxicam and Flexeril she has also been referred to physical therapy but found no relief.     Review of Systems: Out of a complete 14 system review, the patient complains of only the following symptoms, and all other reviewed systems are negative. See above   Social History   Socioeconomic History  . Marital status: Married    Spouse name: Not on file  . Number of children: Not on file  . Years of education: Not on file  . Highest education level: Not on file  Occupational History  . Not on file  Tobacco Use  . Smoking status: Never Smoker  . Smokeless tobacco: Never Used  Substance and Sexual Activity  . Alcohol use: Yes    Comment: social  . Drug use: No  . Sexual activity: Yes    Partners: Male    Birth control/protection: Surgical  Other Topics Concern  . Not on file  Social History Narrative  . Not on file   Social Determinants of Health  Financial Resource Strain:   . Difficulty of Paying Living Expenses:   Food Insecurity:   . Worried About Charity fundraiser in the Last Year:   . Arboriculturist in the Last Year:   Transportation Needs:   . Film/video editor (Medical):   Marland Kitchen Lack of Transportation (Non-Medical):   Physical Activity:   . Days of Exercise per Week:   . Minutes of Exercise per Session:   Stress:   . Feeling of Stress :   Social Connections:   . Frequency of Communication with Friends and Family:   . Frequency of Social Gatherings with Friends and Family:   . Attends Religious Services:   . Active Member of Clubs or Organizations:   . Attends Archivist Meetings:   Marland Kitchen  Marital Status:   Intimate Partner Violence:   . Fear of Current or Ex-Partner:   . Emotionally Abused:   Marland Kitchen Physically Abused:   . Sexually Abused:     Family History  Problem Relation Age of Onset  . High blood pressure Father   . Diabetes Father   . High blood pressure Maternal Grandmother   . Diabetes Maternal Grandmother   . High blood pressure Maternal Grandfather   . Diabetes Maternal Grandfather     Past Medical History:  Diagnosis Date  . ADHD (attention deficit hyperactivity disorder) 08/2017  . Anemia   . Anxiety 08/2017  . Asthma    environmental   . Endometriosis    CAUSING PELVIC PAIN AND CRAMPING "LIKE CONTRACTIONS" - PREVIOUS TUBAL LIGATION AND ENDOMETRIAL ABLATION ( MARCH 2014) - NOT HAVING MENSTRUAL PERIODS  . GERD (gastroesophageal reflux disease)   . History of blood transfusion 2006   [facility]    Past Surgical History:  Procedure Laterality Date  . ANTERIOR AND POSTERIOR REPAIR N/A 08/10/2013   Procedure: VAGINAL CUFF REPAIR;  Surgeon: Lahoma Crocker, MD;  Location: [facility];  Service: Gynecology;  Laterality: N/A;  . CHOLECYSTECTOMY N/A 10/31/2018   Procedure: LAPAROSCOPIC CHOLECYSTECTOMY;  Surgeon: Clovis Riley, MD;  Location: [facility];  Service: General;  Laterality: N/A;  . DIAGNOSTIC LAPAROSCOPY     ectopic, right salpingectomy  . DILATION AND CURETTAGE OF UTERUS     ablation  . HYSTEROSCOPY N/A 03/02/2013   Procedure: HYSTEROSCOPY;  Surgeon: Shelly Bombard, MD;  Location: [facility];  Service: Gynecology;  Laterality: N/A;  . NASAL SINUS SURGERY    . ROBOTIC ASSISTED TOTAL HYSTERECTOMY N/A 07/06/2013   Procedure: ROBOTIC ASSISTED TOTAL HYSTERECTOMY;  Surgeon: Lahoma Crocker, MD;  Location: [facility];  Service: Gynecology;  Laterality: N/A;  . SALPINGOOPHORECTOMY Left 07/06/2013   Procedure: SALPINGO OOPHORECTOMY;  Surgeon: Lahoma Crocker, MD;  Location: [facility];  Service: Gynecology;  Laterality: Left;  . TUBAL LIGATION        Current Outpatient Medications  Medication Sig Dispense Refill  . ALPRAZolam (XANAX) 0.5 MG tablet Take 0.5 mg by mouth at bedtime as needed for sleep.     Marland Kitchen amphetamine-dextroamphetamine (ADDERALL) 20 MG tablet     . cyclobenzaprine (FLEXERIL) 10 MG tablet Take 10 mg by mouth daily as needed for muscle spasms.     . diclofenac (VOLTAREN) 75 MG EC tablet Take 75 mg by mouth 2 (two) times daily.    . diphenhydrAMINE (BENADRYL) 25 MG tablet Take 25-50 mg by mouth daily as needed for allergies.    . fluticasone (FLOVENT HFA) 110 MCG/ACT inhaler Inhale three puffs three times daily during  asthma flare-up.  Rinse, gargle, and spit after use. (Patient taking differently: Inhale 1 puff into the lungs 3 (three) times daily as needed (asthma flare). Rinse, gargle, and spit after use.) 1 Inhaler 5  . HYDROcodone-acetaminophen (NORCO) 7.5-325 MG tablet Take 1 tablet by mouth every 8 (eight) hours as needed.    Marland Kitchen lisdexamfetamine (VYVANSE) 30 MG capsule Take 30 mg by mouth daily.     . Multiple Vitamins-Minerals (MULTIVITAMIN WITH MINERALS) tablet Take 1 tablet by mouth once a week.     . pregabalin (LYRICA) 75 MG capsule     . traMADol (ULTRAM) 50 MG tablet Take by mouth every 6 (six) hours as needed.     No current facility-administered medications for this visit.    Allergies as of 04/29/2020 - Review Complete 04/29/2020  Allergen Reaction Noted  . Kiwi extract Itching and Swelling 06/26/2013  . Tramadol Itching 10/26/2018    Vitals: BP (!) 138/95   Pulse 85   Ht 5\' 3"  (1.6 m)   Wt 133 lb (60.3 kg)   BMI 23.56 kg/m  Last Weight:  Wt Readings from Last 1 Encounters:  04/29/20 133 lb (60.3 kg)   Last Height:   Ht Readings from Last 1 Encounters:  04/29/20 5\' 3"  (1.6 m)    Physical exam:  General: The patient is awake, alert and appears not in acute distress. The patient is well groomed. Head: Normocephalic, atraumatic. Neck is supple. Mallampati 1, neck  circumference:13.25" Cardiovascular:  Regular rate and rhythm, without  murmurs or carotid bruit, and without distended neck veins. Respiratory: Lungs are clear to auscultation. Skin:  Without evidence of edema, or rash Trunk: BMI is 23.56 and patient  has normal posture.  Neurologic exam : The patient is awake and alert, oriented to place and time.  Memory subjective described as intact. There is a normal attention span & concentration ability. Speech is fluent without dysarthria, dysphonia or aphasia. Mood and affect are appropriate.  Cranial nerves: Pupils are equal and briskly reactive to light. Funduscopic exam deferred. Extraocular movements  in vertical and horizontal planes intact and without nystagmus.  Visual fields by finger perimetry are intact. Hearing to finger rub intact.  Facial sensation intact to fine touch. Facial motor strength is symmetric and tongue and uvula move midline. Tongue protrusion into either cheek is normal. Shoulder shrug is normal.   Motor exam:  Normal tone ,muscle bulk and symmetric strength in all extremities.no pronator drift of the outstretched arm.  Sensory:  Fine touch, pinprick and vibration were tested in all extremities.  Mrs. Wiechman describes a slight decrease in pressure sensation at the left neck left upper extremity left face.  Vibration was equally sensed in both body hemispheres,  Temperature and fine touch by Q-tip was similarly perceived in both sides of the face neck upper extremities Proprioception was normal.   Coordination: Rapid alternating movements in the fingers/hands were normal.  Finger-to-nose maneuver  normal without evidence of ataxia, dysmetria or tremor.  Gait and station: Patient walks without assistive device and is able unassisted to climb up to the exam table. Strength within normal limits. Stance is stable and normal. Tandem gait is unfragmented. Romberg testing is negative   Deep tendon reflexes: in the  upper and  lower extremities are symmetric and intact. Babinski maneuver response is downgoing.   Assessment:  After physical and neurologic examination, review of laboratory studies, imaging, neurophysiology testing and pre-existing records, assessment is that of :   Patient with healthcare  tests/ results/ providers/ in many non-overlapping health care  systems.    Rather ill defined area of numbness, there is not sensory loss to fine touch over the face.   There is no abnormalitiy in eye movements, finger to nose testing, and not drift- pronator drift. There is no indication of subtle weakness.  .   I would like to review her MRI cervical spine - she underwent testing with an orthopedic office , the films are not accessible by [facility] program.  Plan:  Treatment plan and additional workup :  We discussed Screening MRI for any indication of demyelination in the spinal cervical cord, I would like to obtain MRI with and without contrast.  It would be beneficial for radiologist to have a comparison study from Dr. Maurice Small.  Dr Maurice Small did EMG and NCV- reportedly normal.     Melvyn Novas MD 04/29/2020

## 2020-05-06 ENCOUNTER — Other Ambulatory Visit: Payer: Self-pay

## 2020-05-06 ENCOUNTER — Ambulatory Visit
Admission: RE | Admit: 2020-05-06 | Discharge: 2020-05-06 | Disposition: A | Discharge status: Home / Self Care | Source: Ambulatory Visit | Attending: Neurology | Admitting: Neurology

## 2020-05-06 ENCOUNTER — Other Ambulatory Visit

## 2020-05-06 DIAGNOSIS — R202 Paresthesia of skin: Secondary | ICD-10-CM

## 2020-05-06 MED ORDER — GADOBENATE DIMEGLUMINE 529 MG/ML IV SOLN
12.0000 mL | Freq: Once | INTRAVENOUS | Status: AC | PRN
  Administered 2020-05-06: 12 mL via INTRAVENOUS

## 2020-05-13 ENCOUNTER — Telehealth: Payer: Self-pay | Admitting: Neurology

## 2020-05-13 NOTE — Telephone Encounter (Signed)
Yes result note is in. CD

## 2020-05-13 NOTE — Telephone Encounter (Signed)
Pt has called for the results of her MRI, please call when available.

## 2020-05-13 NOTE — Progress Notes (Signed)
IMPRESSION:   Unremarkable MRI cervical spine (with and without). No spinal stenosis or foraminal narrowing.  No change from 12/14/19.    INTERPRETING PHYSICIAN:  Suanne Marker, MD

## 2020-05-13 NOTE — Telephone Encounter (Signed)
Dr. Vickey Huger- are MRI results available? I am happy to call pt, just let me know. Thank you

## 2020-05-14 ENCOUNTER — Telehealth: Payer: Self-pay

## 2020-05-14 NOTE — Telephone Encounter (Signed)
Please let ortho get the lumbar MRI , that's usually skeletal and not CNS related.

## 2020-05-14 NOTE — Telephone Encounter (Signed)
I called pt that per Dr. Vickey Huger she can follow up with orthopedic to request the MR lumbar spine for her leg pain. I stated per MD is usually skeletal not CNS.PT verbalized understanding.

## 2020-05-14 NOTE — Telephone Encounter (Signed)
Made in error

## 2020-05-14 NOTE — Telephone Encounter (Signed)
Left voice mail for patient to call back about MRI cervical spine results.

## 2020-05-14 NOTE — Telephone Encounter (Addendum)
I called patient that MRI cervical spine showed no spinal stenosis or foraminal narrowing. No change from 12/14/2019. Pt verbalized understanding. Pt state she has numbness in her leg and her orthopedic MD recommend a MRI lumbar. Pt stated she saw orthopedic yesterday. I advise pt to call orthopedic to maybe order the scan for her.I advise pt Dr. Vickey Huger will be sent to review of her request.

## 2020-05-14 NOTE — Telephone Encounter (Signed)
Pt has called Katrina,RN back for results.  Please call pt.

## 2020-05-24 ENCOUNTER — Other Ambulatory Visit

## 2022-02-26 ENCOUNTER — Other Ambulatory Visit: Payer: Self-pay | Admitting: Specialist

## 2022-02-26 DIAGNOSIS — M545 Low back pain, unspecified: Secondary | ICD-10-CM

## 2022-02-26 DIAGNOSIS — R102 Pelvic and perineal pain: Secondary | ICD-10-CM

## 2022-03-04 ENCOUNTER — Ambulatory Visit
Admission: RE | Admit: 2022-03-04 | Discharge: 2022-03-04 | Disposition: A | Discharge status: Home / Self Care | Source: Ambulatory Visit | Attending: Specialist | Admitting: Specialist

## 2022-03-04 DIAGNOSIS — G8929 Other chronic pain: Secondary | ICD-10-CM

## 2022-03-04 DIAGNOSIS — R102 Pelvic and perineal pain: Secondary | ICD-10-CM

## 2022-03-04 MED ORDER — IOPAMIDOL (ISOVUE-M 200) INJECTION 41%
15.0000 mL | Freq: Once | INTRAMUSCULAR | Status: AC
  Administered 2022-03-04: 15 mL via INTRATHECAL

## 2022-03-04 MED ORDER — MEPERIDINE HCL 50 MG/ML IJ SOLN
50.0000 mg | Freq: Once | INTRAMUSCULAR | Status: AC | PRN
  Administered 2022-03-04: 50 mg via INTRAMUSCULAR

## 2022-03-04 MED ORDER — ONDANSETRON HCL 4 MG/2ML IJ SOLN
4.0000 mg | Freq: Once | INTRAMUSCULAR | Status: AC | PRN
  Administered 2022-03-04: 4 mg via INTRAMUSCULAR

## 2022-03-04 MED ORDER — DIAZEPAM 5 MG PO TABS
10.0000 mg | ORAL_TABLET | Freq: Once | ORAL | Status: AC
  Administered 2022-03-04: 10 mg via ORAL

## 2022-03-04 NOTE — Discharge Instructions (Signed)

## 2022-03-04 NOTE — Discharge Instr - Other Orders (Signed)
1054: pt reports pain 9/10 from myelogram procedure. See MAR.  ?

## 2023-09-09 ENCOUNTER — Encounter (HOSPITAL_COMMUNITY): Payer: Self-pay

## 2023-09-09 ENCOUNTER — Ambulatory Visit (HOSPITAL_COMMUNITY)
Admission: RE | Admit: 2023-09-09 | Discharge: 2023-09-09 | Disposition: A | Discharge status: Home / Self Care | Source: Ambulatory Visit

## 2023-09-09 VITALS — BP 150/101 | HR 83 | Temp 98.5°F | Resp 17

## 2023-09-09 DIAGNOSIS — J329 Chronic sinusitis, unspecified: Secondary | ICD-10-CM

## 2023-09-09 DIAGNOSIS — J40 Bronchitis, not specified as acute or chronic: Secondary | ICD-10-CM | POA: Diagnosis not present

## 2023-09-09 DIAGNOSIS — R1110 Vomiting, unspecified: Secondary | ICD-10-CM

## 2023-09-09 MED ORDER — PROMETHAZINE-DM 6.25-15 MG/5ML PO SYRP: 5.0000 mL | ORAL_SOLUTION | Freq: Three times a day (TID) | ORAL | 0 refills | Status: DC | PRN

## 2023-09-09 MED ORDER — AZITHROMYCIN 250 MG PO TABS: 250.0000 mg | ORAL_TABLET | Freq: Every day | ORAL | 0 refills | Status: AC

## 2023-09-09 MED ORDER — ALBUTEROL SULFATE HFA 108 (90 BASE) MCG/ACT IN AERS
2.0000 | INHALATION_SPRAY | Freq: Once | RESPIRATORY_TRACT | Status: AC
  Administered 2023-09-09: 2 via RESPIRATORY_TRACT

## 2023-09-09 MED ORDER — ALBUTEROL SULFATE HFA 108 (90 BASE) MCG/ACT IN AERS
INHALATION_SPRAY | RESPIRATORY_TRACT | Status: AC
  Filled 2023-09-09: qty 6.7

## 2023-09-09 NOTE — ED Triage Notes (Signed)
Did virtual visit on Monday for her sinus infection, was prescribed antibiotics which is starting to help but cough is still horrible esp at night. Coughing so hard causing stress incontinence.
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
## 2023-09-09 NOTE — Discharge Instructions (Addendum)
Use the albuterol inhaler every 4-6 hours as needed.  Complete the course of prednisone that was previously prescribed.  Stop the cefdinir/Omnicef and start azithromycin.  Use Promethazine DM for cough.  This will make you sleepy so do not drive or drink alcohol while taking it.  Use over-the-counter medications including Mucinex, Flonase, Tylenol.  Also recommend nasal saline/sinus rinses and humidifier in the room.  If your symptoms are not improving by next week please return for reevaluation.  If anything worsens you have increasing cough, shortness of breath, high fever, weakness you need to be seen immediately.

## 2024-01-08 ENCOUNTER — Ambulatory Visit (HOSPITAL_COMMUNITY): Admission: EM | Admit: 2024-01-08 | Discharge: 2024-01-08 | Disposition: A | Discharge status: Home / Self Care

## 2024-01-08 ENCOUNTER — Encounter (HOSPITAL_COMMUNITY): Payer: Self-pay | Admitting: Emergency Medicine

## 2024-01-08 DIAGNOSIS — Z20828 Contact with and (suspected) exposure to other viral communicable diseases: Secondary | ICD-10-CM | POA: Diagnosis not present

## 2024-01-08 DIAGNOSIS — J069 Acute upper respiratory infection, unspecified: Secondary | ICD-10-CM | POA: Diagnosis not present

## 2024-01-08 MED ORDER — OSELTAMIVIR PHOSPHATE 75 MG PO CAPS: 75.0000 mg | ORAL_CAPSULE | Freq: Two times a day (BID) | ORAL | 0 refills | Status: AC

## 2024-01-08 MED ORDER — PROMETHAZINE-DM 6.25-15 MG/5ML PO SYRP: 5.0000 mL | ORAL_SOLUTION | Freq: Three times a day (TID) | ORAL | 0 refills | Status: AC | PRN

## 2024-01-08 MED ORDER — PREDNISONE 20 MG PO TABS: 40.0000 mg | ORAL_TABLET | Freq: Every day | ORAL | 0 refills | Status: AC

## 2024-01-08 NOTE — ED Provider Notes (Signed)
[facility]    CSN: [id number] Arrival date & time: 01/08/24  1047      History   Chief Complaint Chief Complaint  Patient presents with   Cough   Nasal Congestion    HPI Andrea Waters is a 43 y.o. female.   43 year old female who presents urgent care with complaints of fever, chills, body aches, congestion, cough and diarrhea.  She began having symptoms on Wednesday night.  The symptoms have worsened since then.  She has been using over-the-counter Benadryl, Mucinex and DayQuil but her symptoms are not improving.  She has had exposure to influenza within the last week.  She denies nausea or vomiting but has no appetite.  She reports that the cough is much worse at night.  She has developed a headache as well.   Cough Associated symptoms: chills and fever   Associated symptoms: no chest pain, no ear pain, no rash, no shortness of breath and no sore throat     Past Medical History:  Diagnosis Date   ADHD (attention deficit hyperactivity disorder) 08/2017   Anemia    Anxiety 08/2017   Asthma    environmental    Endometriosis    CAUSING PELVIC PAIN AND CRAMPING "LIKE CONTRACTIONS" - PREVIOUS TUBAL LIGATION AND ENDOMETRIAL ABLATION ( MARCH 2014) - NOT HAVING MENSTRUAL PERIODS   GERD (gastroesophageal reflux disease)    History of blood transfusion 2006   [facility]    Patient Active Problem List   Diagnosis Date Noted   Facial tingling sensation 04/29/2020   Female genital symptoms 07/25/2013   Endometriosis 05/08/2013   Dysmenorrhea 05/08/2013   Excessive or frequent menstruation 05/08/2013    Past Surgical History:  Procedure Laterality Date   ANTERIOR AND POSTERIOR REPAIR N/A 08/10/2013   Procedure: VAGINAL CUFF REPAIR;  Surgeon: Antionette Char, MD;  Location: [facility];  Service: Gynecology;  Laterality: N/A;   CHOLECYSTECTOMY N/A 10/31/2018   Procedure: LAPAROSCOPIC CHOLECYSTECTOMY;  Surgeon: Berna Bue, MD;   Location: [facility];  Service: General;  Laterality: N/A;   DIAGNOSTIC LAPAROSCOPY     ectopic, right salpingectomy   DILATION AND CURETTAGE OF UTERUS     ablation   HYSTEROSCOPY N/A 03/02/2013   Procedure: HYSTEROSCOPY;  Surgeon: Brock Bad, MD;  Location: [facility];  Service: Gynecology;  Laterality: N/A;   NASAL SINUS SURGERY     ROBOTIC ASSISTED TOTAL HYSTERECTOMY N/A 07/06/2013   Procedure: ROBOTIC ASSISTED TOTAL HYSTERECTOMY;  Surgeon: Antionette Char, MD;  Location: [facility];  Service: Gynecology;  Laterality: N/A;   rotator cuff surgery     SALPINGOOPHORECTOMY Left 07/06/2013   Procedure: SALPINGO OOPHORECTOMY;  Surgeon: Antionette Char, MD;  Location: [facility];  Service: Gynecology;  Laterality: Left;   TUBAL LIGATION      OB History     Gravida  5   Para  3   Term  3   Preterm      AB  2   Living  3      SAB  1   IAB      Ectopic  1   Multiple      Live Births               Home Medications    Prior to Admission medications   Medication Sig Start Date End Date Taking? Authorizing Provider  hydrochlorothiazide (HYDRODIURIL) 12.5 MG tablet Take 12.5 mg by mouth daily. 12/31/23  Yes [provider]  ALPRAZolam Prudy Feeler) 0.5  MG tablet Take 0.5 mg by mouth at bedtime as needed for sleep.     [provider]  amphetamine-dextroamphetamine (ADDERALL) 20 MG tablet  02/22/20   [provider]  azithromycin (ZITHROMAX) 250 MG tablet Take 1 tablet (250 mg total) by mouth daily. Take first 2 tablets together, then 1 every day until finished. 09/09/23   Raspet, Noberto Retort, PA-C  cyclobenzaprine (FLEXERIL) 10 MG tablet Take 10 mg by mouth daily as needed for muscle spasms.     [provider]  diclofenac (VOLTAREN) 75 MG EC tablet Take 75 mg by mouth 2 (two) times daily. 04/22/20   [provider]  diphenhydrAMINE (BENADRYL) 25 MG tablet Take 25-50 mg by mouth daily as needed for allergies.    [provider]   fluticasone (FLOVENT HFA) 110 MCG/ACT inhaler Inhale three puffs three times daily during asthma flare-up.  Rinse, gargle, and spit after use. Patient taking differently: Inhale 1 puff into the lungs 3 (three) times daily as needed (asthma flare). Rinse, gargle, and spit after use. 01/13/16   Kozlow, Alvira Philips, MD  HYDROcodone-acetaminophen (NORCO) 7.5-325 MG tablet Take 1 tablet by mouth every 8 (eight) hours as needed. 04/22/20   [provider]  lisdexamfetamine (VYVANSE) 30 MG capsule Take 30 mg by mouth daily.     [provider]  Multiple Vitamins-Minerals (MULTIVITAMIN WITH MINERALS) tablet Take 1 tablet by mouth once a week.     [provider]  pregabalin (LYRICA) 75 MG capsule  04/22/20   [provider]  promethazine-dextromethorphan (PROMETHAZINE-DM) 6.25-15 MG/5ML syrup Take 5 mLs by mouth 3 (three) times daily as needed for cough. 09/09/23   Raspet, Noberto Retort, PA-C  traMADol (ULTRAM) 50 MG tablet Take by mouth every 6 (six) hours as needed.    [provider]    Family History Family History  Problem Relation Age of Onset   High blood pressure Father    Diabetes Father    High blood pressure Maternal Grandmother    Diabetes Maternal Grandmother    High blood pressure Maternal Grandfather    Diabetes Maternal Grandfather     Social History Social History   Tobacco Use   Smoking status: Never   Smokeless tobacco: Never  Vaping Use   Vaping status: Never Used  Substance Use Topics   Alcohol use: Yes    Comment: social   Drug use: No     Allergies   Kiwi extract, Oxycodone, and Tramadol   Review of Systems Review of Systems  Constitutional:  Positive for appetite change, chills, fatigue and fever.  HENT:  Positive for congestion. Negative for ear pain and sore throat.   Eyes:  Negative for pain and visual disturbance.  Respiratory:  Positive for cough. Negative for shortness of breath.   Cardiovascular:  Negative for chest  pain and palpitations.  Gastrointestinal:  Negative for abdominal pain and vomiting.  Genitourinary:  Negative for dysuria and hematuria.  Musculoskeletal:  Negative for arthralgias and back pain.       Body aches  Skin:  Negative for color change and rash.  Neurological:  Negative for seizures and syncope.  All other systems reviewed and are negative.    Physical Exam Triage Vital Signs ED Triage Vitals  Encounter Vitals Group     BP 01/08/24 1155 (!) 126/90     Systolic BP Percentile --      Diastolic BP Percentile --      Pulse Rate 01/08/24 1155 96  Resp 01/08/24 1155 16     Temp 01/08/24 1155 100.3 F (37.9 C)     Temp Source 01/08/24 1155 Oral     SpO2 01/08/24 1155 98 %     Weight --      Height --      Head Circumference --      Peak Flow --      Pain Score 01/08/24 1153 7     Pain Loc --      Pain Education --      Exclude from Growth Chart --    No data found.  Updated Vital Signs BP (!) 126/90 (BP Location: Left Arm)   Pulse 96   Temp 100.3 F (37.9 C) (Oral)   Resp 16   SpO2 98%   Visual Acuity Right Eye Distance:   Left Eye Distance:   Bilateral Distance:    Right Eye Near:   Left Eye Near:    Bilateral Near:     Physical Exam Vitals and nursing note reviewed.  Constitutional:      General: She is not in acute distress.    Appearance: She is well-developed.  HENT:     Head: Normocephalic and atraumatic.     Right Ear: Tympanic membrane normal.     Left Ear: Tympanic membrane normal.     Nose: Congestion present.     Mouth/Throat:     Mouth: Mucous membranes are moist.  Eyes:     Conjunctiva/sclera: Conjunctivae normal.  Cardiovascular:     Rate and Rhythm: Normal rate and regular rhythm.     Heart sounds: No murmur heard. Pulmonary:     Effort: Pulmonary effort is normal. No respiratory distress.     Breath sounds: Normal breath sounds.  Abdominal:     Palpations: Abdomen is soft.     Tenderness: There is no abdominal  tenderness.  Musculoskeletal:        General: No swelling.     Cervical back: Neck supple.  Skin:    General: Skin is warm and dry.     Capillary Refill: Capillary refill takes less than 2 seconds.  Neurological:     General: No focal deficit present.     Mental Status: She is alert.  Psychiatric:        Mood and Affect: Mood normal.      UC Treatments / Results  Labs (all labs ordered are listed, but only abnormal results are displayed) Labs Reviewed - No data to display  EKG   Radiology No results found.  Procedures Procedures (including critical care time)  Medications Ordered in UC Medications - No data to display  Initial Impression / Assessment and Plan / UC Course  I have reviewed the triage vital signs and the nursing notes.  Pertinent labs & imaging results that were available during my care of the patient were reviewed by me and considered in my medical decision making (see chart for details).     Viral URI with cough  Exposure to influenza  Symptoms most consistent with influenza given the recent exposure. This is a virus and does not require antibiotics. We will treat with the following:  Tamiflu 75 mg twice daily for 5 days. Discontinue if side effects develop. Prednisone 40 mg (2 tablets) daily for 5 days. Take this in the morning. This is a steroid to help with inflammation and pain Promethazine DM 5 mL every 8 hours as needed for cough.  Use caution as this medication can  cause drowsiness. Rest and stay hydrated Return to urgent care or PCP if symptoms worsen or fail to resolve.    Final Clinical Impressions(s) / UC Diagnoses   Final diagnoses:  Viral URI with cough  Exposure to influenza     Discharge Instructions      Symptoms most consistent with influenza given the recent exposure. This is a virus and does not require antibiotics. We will treat with the following:  Tamiflu 75 mg twice daily for 5 days. Discontinue if side effects  develop. Prednisone 40 mg (2 tablets) daily for 5 days. Take this in the morning. This is a steroid to help with inflammation and pain Promethazine DM 5 mL every 8 hours as needed for cough.  Use caution as this medication can cause drowsiness. Rest and stay hydrated Return to urgent care or PCP if symptoms worsen or fail to resolve.     ED Prescriptions   None    PDMP not reviewed this encounter.   Landis Martins, [place] 01/08/24 1236

## 2024-01-08 NOTE — ED Triage Notes (Signed)
Pt reports Wed had cough, congestion, body aches, chills. Next day started having diarrhea.  Benadryl, Mucinex, Dayquil,

## 2024-01-08 NOTE — Discharge Instructions (Addendum)
Symptoms most consistent with influenza given the recent exposure. This is a virus and does not require antibiotics. We will treat with the following:  Tamiflu 75 mg twice daily for 5 days. Discontinue if side effects develop. Prednisone 40 mg (2 tablets) daily for 5 days. Take this in the morning. This is a steroid to help with inflammation and pain Promethazine DM 5 mL every 8 hours as needed for cough.  Use caution as this medication can cause drowsiness. Rest and stay hydrated Return to urgent care or PCP if symptoms worsen or fail to resolve.

## 2024-03-10 IMAGING — XA DG MYELOGRAPHY LUMBAR INJ LUMBOSACRAL
14 of 15 series · 14 of 15 positions shown · non-contrast
Comparison: MRI 08/23/2014

CLINICAL DATA: Left thigh and leg symptoms. No symptoms on the
right. Normal lumbar MRI in 2862.
TECHNIQUE: Contiguous axial images were obtained through the Lumbar spine after
the intrathecal infusion of infusion. Coronal and sagittal
reconstructions were obtained of the axial image sets.

[Series 1: w lumbar spine ap · 0.15mm/px · 1 of 1 slices shown (1 of 3)]
[im 1/1]
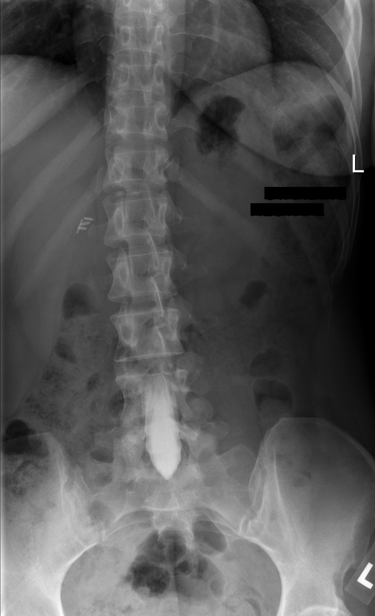

[Series 1: vasc adipose · 1 of 1 slices shown (1 of 8)]
[im 1/1]
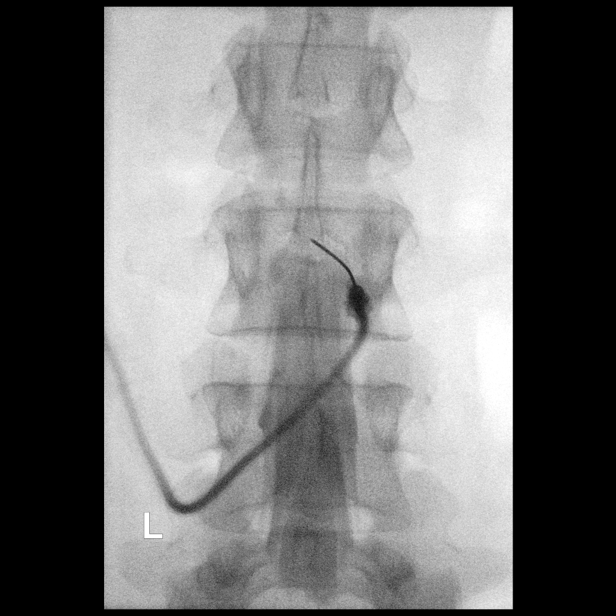

[Series 2: vasc adipose · 1 of 1 slices shown (2 of 8)]
[im 1/1]
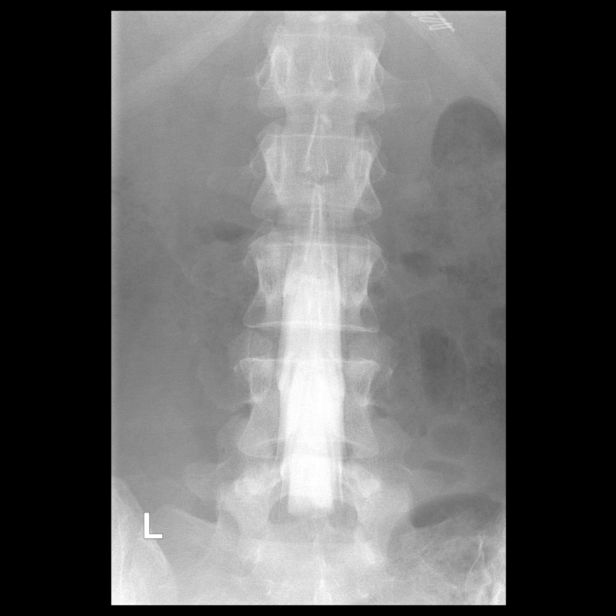

[Series 2: w lumbar spine ap · 0.15mm/px · 1 of 1 slices shown (2 of 3)]
[im 1/1]
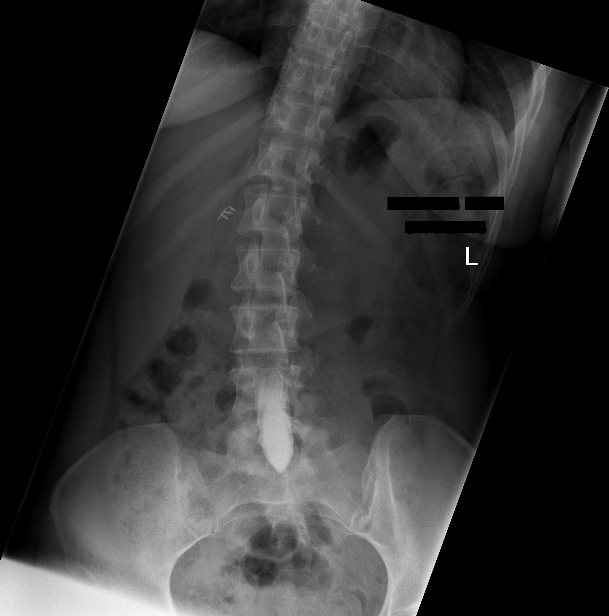

[Series 3: w lumbar spine ap · 0.15mm/px · 1 of 1 slices shown (3 of 3)]
[im 1/1]
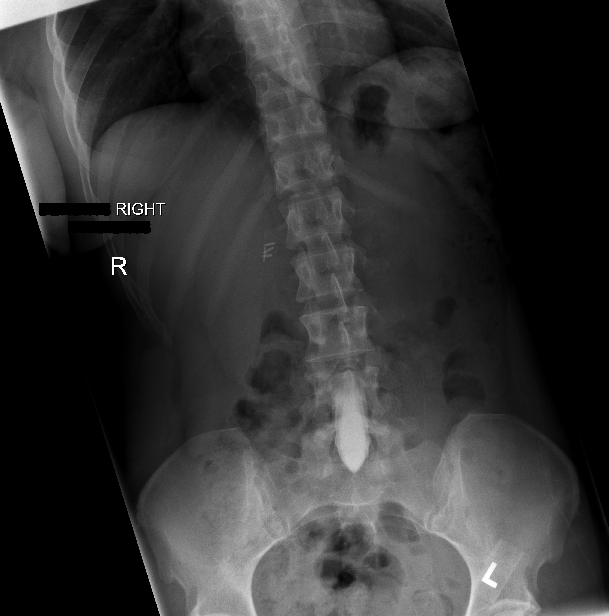

[Series 3: vasc adipose · 1 of 1 slices shown (3 of 8)]
[im 1/1]
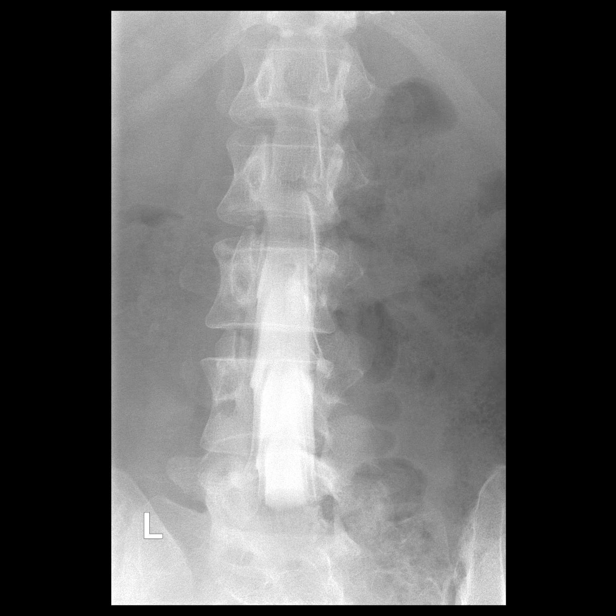

[Series 4: w lumbar spine lat · 0.15mm/px · 1 of 1 slices shown]
[im 1/1]
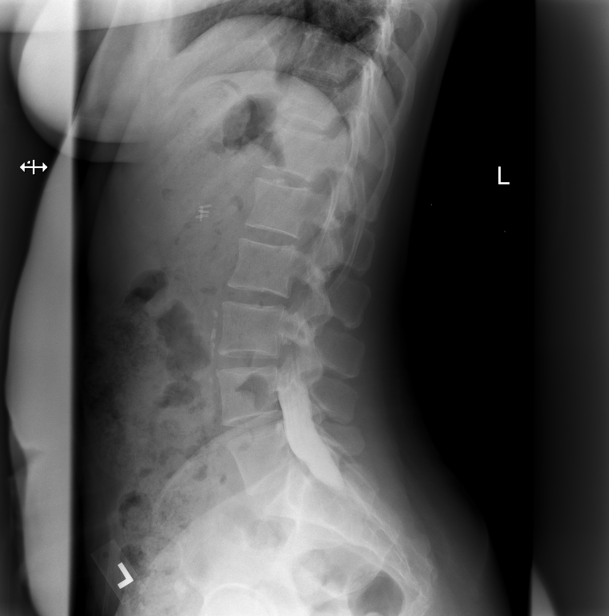

[Series 5: w lumbar spine flexion · 0.15mm/px · 1 of 1 slices shown]
[im 1/1]
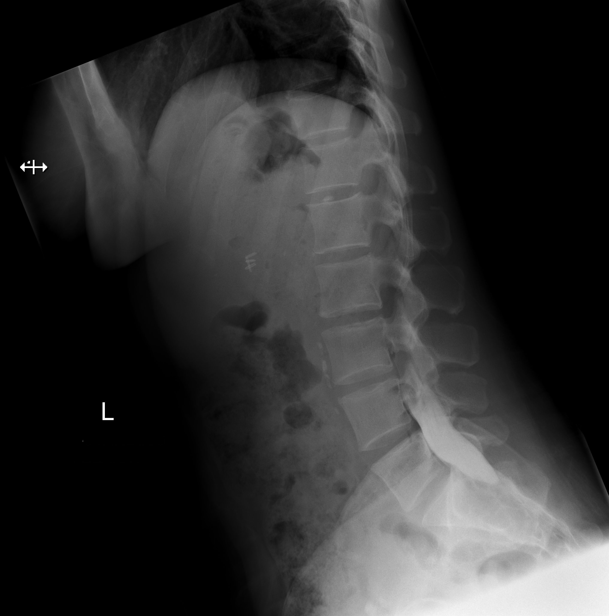

[Series 5: vasc adipose · 1 of 1 slices shown (4 of 8)]
[im 1/1]
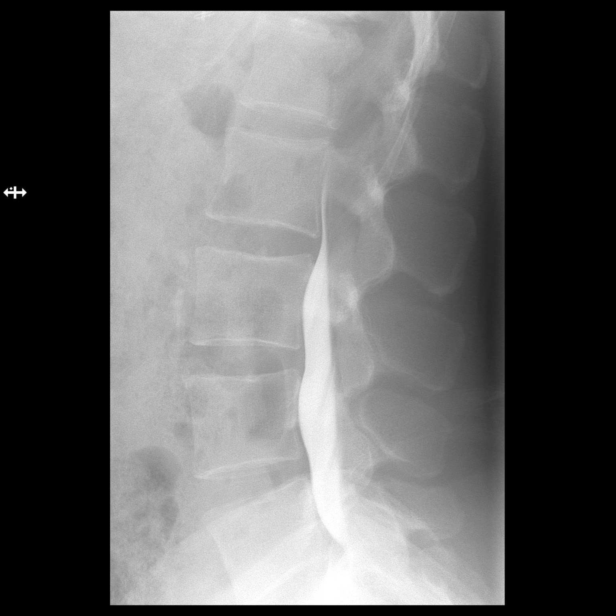

[Series 6: vasc adipose · 1 of 1 slices shown (5 of 8)]
[im 1/1]
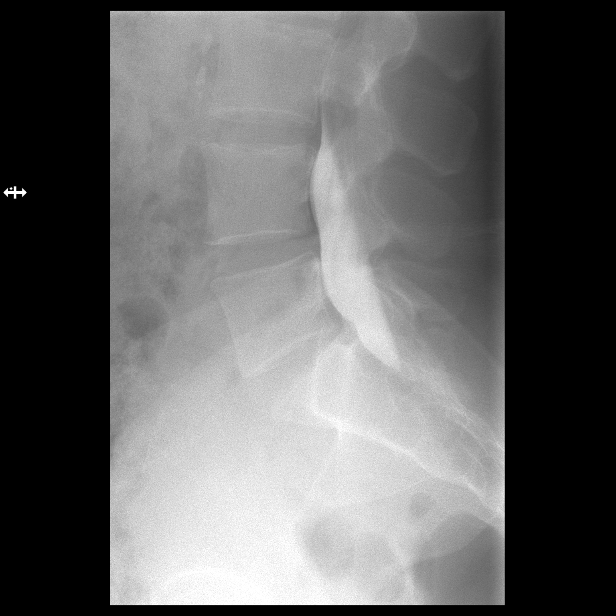

[Series 6: w lumbar spine extension · 0.15mm/px · 1 of 1 slices shown]
[im 1/1]
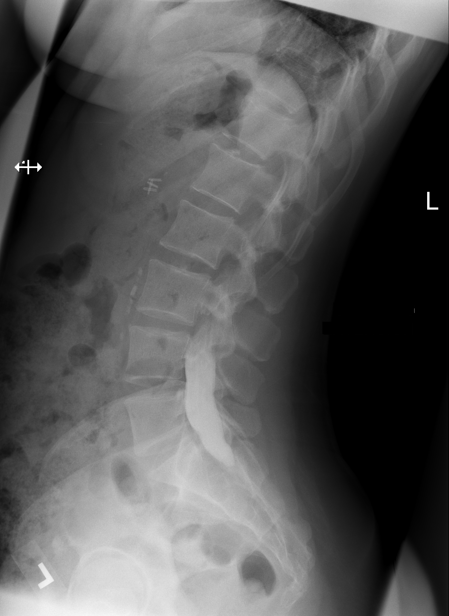

[Series 7: vasc adipose · 1 of 1 slices shown (6 of 8)]
[im 1/1]
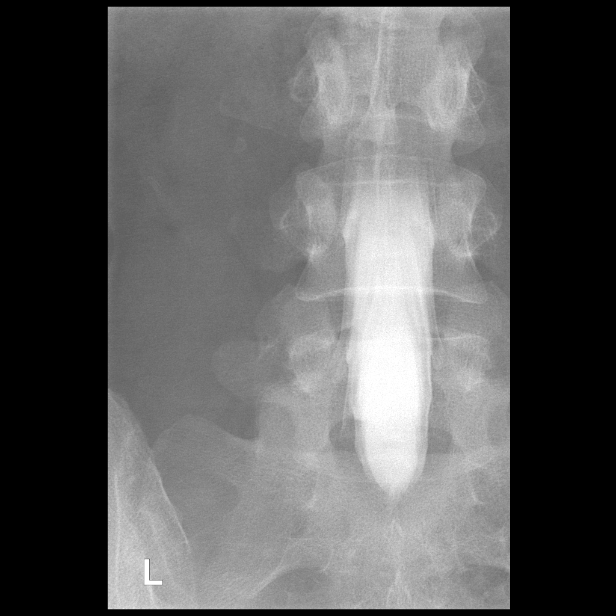

[Series 8: vasc adipose · 1 of 1 slices shown (7 of 8)]
[im 1/1]
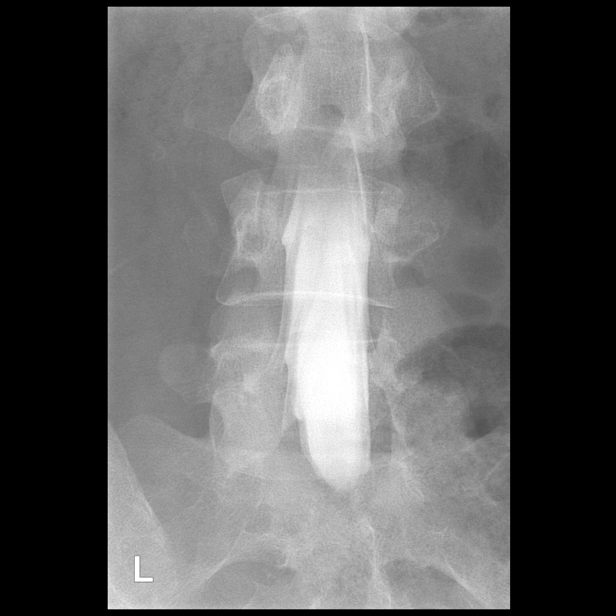

[Series 9: vasc adipose · 1 of 1 slices shown (8 of 8)]
[im 1/1]
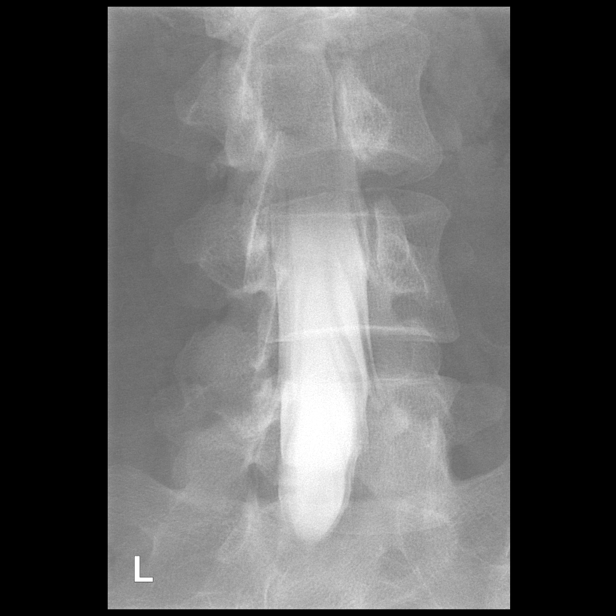

[14 of 15 positions shown; findings below may reference images not displayed]

EXAM:
LUMBAR MYELOGRAM

FLUOROSCOPY:
0 minutes 38 seconds.  104.51 micro gray meter squared

PROCEDURE:
After thorough discussion of risks and benefits of the procedure
including bleeding, infection, injury to nerves, blood vessels,
adjacent structures as well as headache and CSF leak, written and
oral informed consent was obtained. Consent was obtained by Dr. Moolman
Joaan. Time out form was completed.

Patient was positioned prone on the fluoroscopy table. Local
anesthesia was provided with 1% lidocaine without epinephrine after
prepped and draped in the usual sterile fashion. Puncture was
performed at L2-3 using a 3 1/2 inch 22-gauge spinal needle via
right paramedian approach. Using a single pass through the dura, the
needle was placed within the thecal sac, with return of clear CSF.
15 mL of Isovue K-1BB was injected into the thecal sac, with normal
opacification of the nerve roots and cauda equina consistent with
free flow within the subarachnoid space.

I personally performed the lumbar puncture and administered the
intrathecal contrast. I also personally performed acquisition of the
myelogram images.
FINDINGS: LUMBAR MYELOGRAM FINDINGS:

No stenosis or neural compression. Question minimal anterior
extradural defect at L5-S1. Slightly diminished filling of the right
S1 root sleeve compared to the left. See results of CT study.
Standing flexion extension views do not show any antero or
retrolisthesis or abnormal rocking motion. Standing right-to-left
bending films do not show any additional evidence of neural
compression.

CT LUMBAR MYELOGRAM FINDINGS:

Normal alignment.  No disc level abnormality at L4-5 or above.

At L5-S1, there is mild bulging of the disc, more towards the right.
This appears to have slight mass effect upon the right S1 nerve,
symptoms.

Mild bilateral sacroiliac osteoarthritis is present.
IMPRESSION: Normal examination at L4-5 and above.

L5-S1 shows bulging of the disc more prominent towards the right.
This appears to have mild mass-effect upon the right S1 nerve,
without frank compression. Patient does not complain of any right
leg symptoms. Left S1 nerve does not appear affected.

Standing bending films do not show any motion or other finding of
significance.

## 2024-03-10 IMAGING — CT CT L SPINE W/ CM
1 of 7 series · 6 of 14 positions shown, 8 images · non-contrast
Comparison: MRI 08/23/2014

CLINICAL DATA: Left thigh and leg symptoms. No symptoms on the
right. Normal lumbar MRI in 2862.
TECHNIQUE: Contiguous axial images were obtained through the Lumbar spine after
the intrathecal infusion of infusion. Coronal and sagittal
reconstructions were obtained of the axial image sets.

[Series 3: l spine soft · axial · 0.30mm/px · z∈[+36,+206]mm · 6 of 120 slices shown, 8 images]
[im 18/120  soft-tissue]
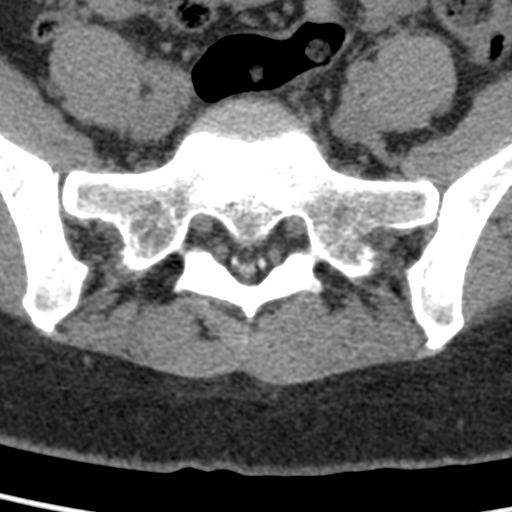
[im 18/120  bone]
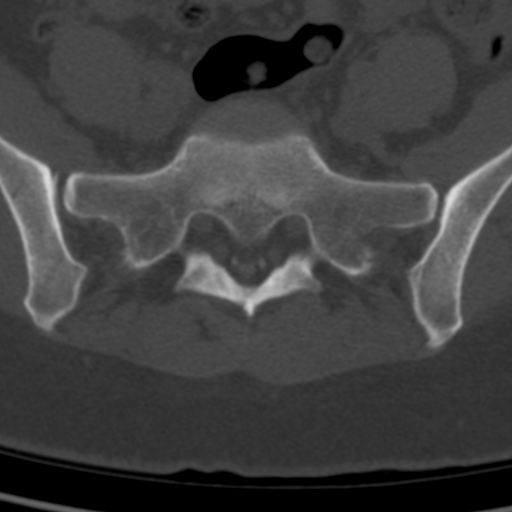
[im 35/120  bone]
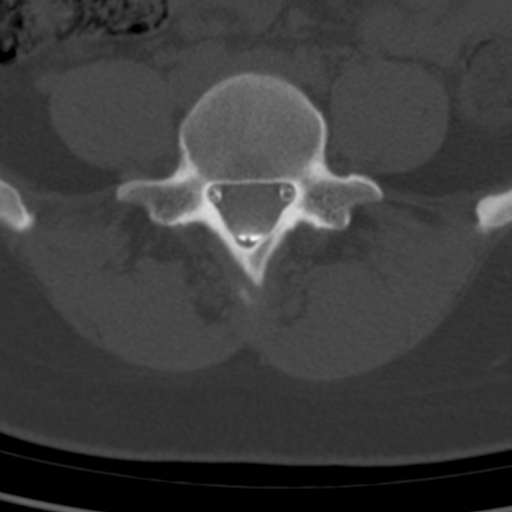
[im 52/120  bone]
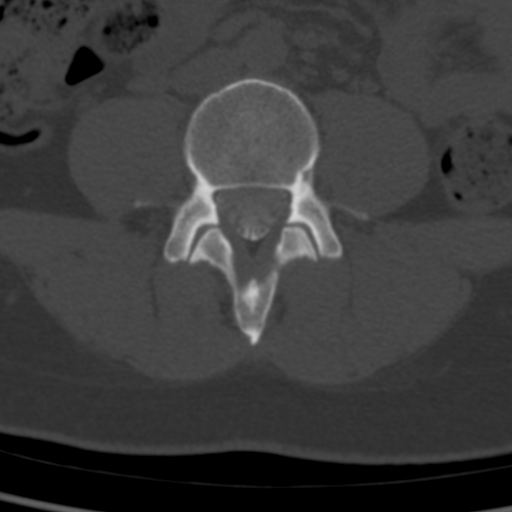
[im 69/120  bone]
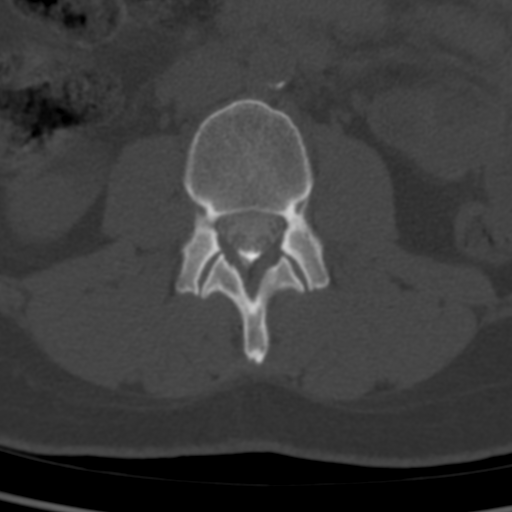
[im 86/120  soft-tissue]
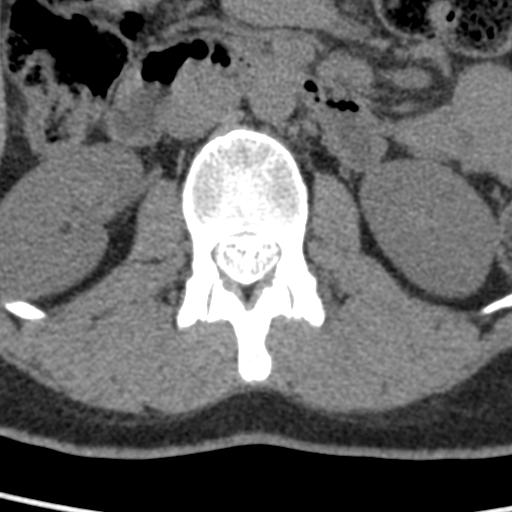
[im 86/120  bone]
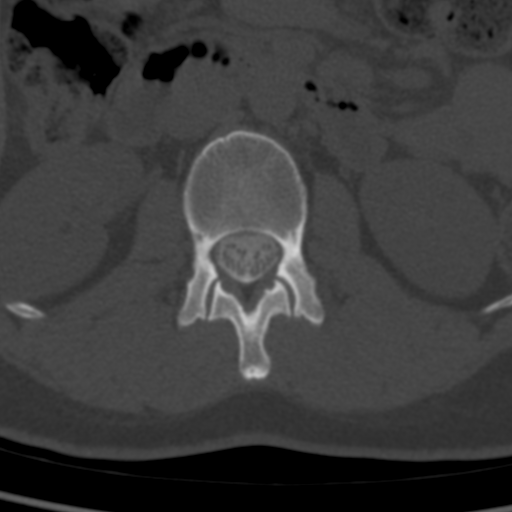
[im 103/120  bone]
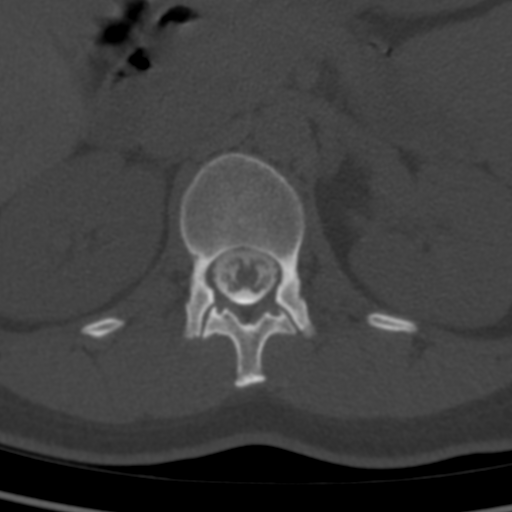

[6 of 14 positions shown; findings below may reference images not displayed]

EXAM:
LUMBAR MYELOGRAM

FLUOROSCOPY:
0 minutes 38 seconds.  104.51 micro gray meter squared

PROCEDURE:
After thorough discussion of risks and benefits of the procedure
including bleeding, infection, injury to nerves, blood vessels,
adjacent structures as well as headache and CSF leak, written and
oral informed consent was obtained. Consent was obtained by Dr. Moolman
Joaan. Time out form was completed.

Patient was positioned prone on the fluoroscopy table. Local
anesthesia was provided with 1% lidocaine without epinephrine after
prepped and draped in the usual sterile fashion. Puncture was
performed at L2-3 using a 3 1/2 inch 22-gauge spinal needle via
right paramedian approach. Using a single pass through the dura, the
needle was placed within the thecal sac, with return of clear CSF.
15 mL of Isovue K-1BB was injected into the thecal sac, with normal
opacification of the nerve roots and cauda equina consistent with
free flow within the subarachnoid space.

I personally performed the lumbar puncture and administered the
intrathecal contrast. I also personally performed acquisition of the
myelogram images.
FINDINGS: LUMBAR MYELOGRAM FINDINGS:

No stenosis or neural compression. Question minimal anterior
extradural defect at L5-S1. Slightly diminished filling of the right
S1 root sleeve compared to the left. See results of CT study.
Standing flexion extension views do not show any antero or
retrolisthesis or abnormal rocking motion. Standing right-to-left
bending films do not show any additional evidence of neural
compression.

CT LUMBAR MYELOGRAM FINDINGS:

Normal alignment.  No disc level abnormality at L4-5 or above.

At L5-S1, there is mild bulging of the disc, more towards the right.
This appears to have slight mass effect upon the right S1 nerve,
symptoms.

Mild bilateral sacroiliac osteoarthritis is present.
IMPRESSION: Normal examination at L4-5 and above.

L5-S1 shows bulging of the disc more prominent towards the right.
This appears to have mild mass-effect upon the right S1 nerve,
without frank compression. Patient does not complain of any right
leg symptoms. Left S1 nerve does not appear affected.

Standing bending films do not show any motion or other finding of
significance.

## 2024-11-09 ENCOUNTER — Ambulatory Visit (HOSPITAL_COMMUNITY)
Admission: EM | Admit: 2024-11-09 | Discharge: 2024-11-09 | Disposition: A | Discharge status: Home / Self Care | Attending: Nurse Practitioner | Admitting: Nurse Practitioner

## 2024-11-09 ENCOUNTER — Encounter (HOSPITAL_COMMUNITY): Payer: Self-pay

## 2024-11-09 DIAGNOSIS — M25511 Pain in right shoulder: Secondary | ICD-10-CM

## 2024-11-09 MED ORDER — METHYLPREDNISOLONE SODIUM SUCC 125 MG IJ SOLR
80.0000 mg | Freq: Once | INTRAMUSCULAR | Status: AC
  Administered 2024-11-09: 80 mg via INTRAMUSCULAR

## 2024-11-09 MED ORDER — PREDNISONE 20 MG PO TABS: ORAL_TABLET | ORAL | 0 refills | Status: AC

## 2024-11-09 MED ORDER — METHYLPREDNISOLONE SODIUM SUCC 125 MG IJ SOLR
INTRAMUSCULAR | Status: AC
  Filled 2024-11-09: qty 2

## 2024-11-09 NOTE — ED Provider Notes (Signed)
 [facility]    CSN: [id number] Arrival date & time: 11/09/24  1046      History   Chief Complaint Chief Complaint  Patient presents with   Shoulder Pain    HPI Andrea Waters is a 43 y.o. female.   Patient presents today with 1 day of right shoulder pain.  Reports some chronic right shoulder pain at baseline due to history of rotator cuff injury with repair.  Reports normally, stretches and naproxen help with the pain.  This pain is worse after working and has been worse since she woke up yesterday morning.  The pain does radiate down the right arm at times.  No weakness in right upper extremity or constant numbness/tingling.  No decreased grip strength or fever, nausea/vomiting.  Is concerned she may have frozen shoulder.   Took naproxen and leftover prednisone  with some improvement yesterday.     Past Medical History:  Diagnosis Date   ADHD (attention deficit hyperactivity disorder) 08/2017   Anemia    Anxiety 08/2017   Asthma    environmental    Endometriosis    CAUSING PELVIC PAIN AND CRAMPING LIKE CONTRACTIONS - PREVIOUS TUBAL LIGATION AND ENDOMETRIAL ABLATION ( MARCH 2014) - NOT HAVING MENSTRUAL PERIODS   GERD (gastroesophageal reflux disease)    History of blood transfusion 2006   [facility]    Patient Active Problem List   Diagnosis Date Noted   Facial tingling sensation 04/29/2020   Female genital symptoms 07/25/2013   Endometriosis 05/08/2013   Dysmenorrhea 05/08/2013   Excessive or frequent menstruation 05/08/2013    Past Surgical History:  Procedure Laterality Date   ANTERIOR AND POSTERIOR REPAIR N/A 08/10/2013   Procedure: VAGINAL CUFF REPAIR;  Surgeon: Olam Mill, MD;  Location: [facility];  Service: Gynecology;  Laterality: N/A;   CHOLECYSTECTOMY N/A 10/31/2018   Procedure: LAPAROSCOPIC CHOLECYSTECTOMY;  Surgeon: Signe Mitzie LABOR, MD;  Location: [facility];  Service: General;  Laterality: N/A;   DIAGNOSTIC  LAPAROSCOPY     ectopic, right salpingectomy   DILATION AND CURETTAGE OF UTERUS     ablation   HYSTEROSCOPY N/A 03/02/2013   Procedure: HYSTEROSCOPY;  Surgeon: Carlin LABOR Centers, MD;  Location: [facility];  Service: Gynecology;  Laterality: N/A;   NASAL SINUS SURGERY     ROBOTIC ASSISTED TOTAL HYSTERECTOMY N/A 07/06/2013   Procedure: ROBOTIC ASSISTED TOTAL HYSTERECTOMY;  Surgeon: Olam Mill, MD;  Location: [facility];  Service: Gynecology;  Laterality: N/A;   rotator cuff surgery     SALPINGOOPHORECTOMY Left 07/06/2013   Procedure: SALPINGO OOPHORECTOMY;  Surgeon: Olam Mill, MD;  Location: [facility];  Service: Gynecology;  Laterality: Left;   TUBAL LIGATION      OB History     Gravida  5   Para  3   Term  3   Preterm      AB  2   Living  3      SAB  1   IAB      Ectopic  1   Multiple      Live Births               Home Medications    Prior to Admission medications   Medication Sig Start Date End Date Taking? Authorizing Provider  predniSONE  (DELTASONE ) 20 MG tablet Take 3 tablets (60mg ) on days 1-2. Take 2 tablets (40mg ) on days 3-4. Take 1 tablet (20mg ) on days 5-6, then stop. 11/09/24  Yes Chandra Harlene LABOR, NP  ALPRAZolam (  XANAX) 0.5 MG tablet Take 0.5 mg by mouth at bedtime as needed for sleep.  Patient not taking: Reported on 11/09/2024    [provider]  amphetamine-dextroamphetamine (ADDERALL) 20 MG tablet  02/22/20   [provider]  azithromycin  (ZITHROMAX ) 250 MG tablet Take 1 tablet (250 mg total) by mouth daily. Take first 2 tablets together, then 1 every day until finished. Patient not taking: Reported on 11/09/2024 09/09/23   Raspet, Erin K, PA-C  cyclobenzaprine (FLEXERIL) 10 MG tablet Take 10 mg by mouth daily as needed for muscle spasms.  Patient not taking: Reported on 11/09/2024    [provider]  diclofenac (VOLTAREN) 75 MG EC tablet Take 75 mg by mouth 2 (two) times daily. Patient not taking: Reported on  11/09/2024 04/22/20   [provider]  diphenhydrAMINE (BENADRYL) 25 MG tablet Take 25-50 mg by mouth daily as needed for allergies.    [provider]  fluticasone  (FLOVENT  HFA) 110 MCG/ACT inhaler Inhale three puffs three times daily during asthma flare-up.  Rinse, gargle, and spit after use. Patient taking differently: Inhale 1 puff into the lungs 3 (three) times daily as needed (asthma flare). Rinse, gargle, and spit after use. 01/13/16   Kozlow, Camellia PARAS, MD  hydrochlorothiazide (HYDRODIURIL) 12.5 MG tablet Take 12.5 mg by mouth daily. 12/31/23   [provider]  HYDROcodone -acetaminophen  (NORCO) 7.5-325 MG tablet Take 1 tablet by mouth every 8 (eight) hours as needed. Patient not taking: Reported on 11/09/2024 04/22/20   [provider]  lisdexamfetamine (VYVANSE) 30 MG capsule Take 30 mg by mouth daily.     [provider]  Multiple Vitamins-Minerals (MULTIVITAMIN WITH MINERALS) tablet Take 1 tablet by mouth once a week.     [provider]  oseltamivir  (TAMIFLU ) 75 MG capsule Take 1 capsule (75 mg total) by mouth every 12 (twelve) hours. Patient not taking: Reported on 11/09/2024 01/08/24   Teresa Almarie LABOR, PA-C  pregabalin (LYRICA) 75 MG capsule  04/22/20   [provider]  promethazine -dextromethorphan (PROMETHAZINE -DM) 6.25-15 MG/5ML syrup Take 5 mLs by mouth every 8 (eight) hours as needed for cough. Patient not taking: Reported on 11/09/2024 01/08/24   Teresa Almarie LABOR, PA-C  traMADol  (ULTRAM ) 50 MG tablet Take by mouth every 6 (six) hours as needed. Patient not taking: Reported on 11/09/2024    [provider]    Family History Family History  Problem Relation Age of Onset   High blood pressure Father    Diabetes Father    High blood pressure Maternal Grandmother    Diabetes Maternal Grandmother    High blood pressure Maternal Grandfather    Diabetes Maternal Grandfather     Social History Social History    Tobacco Use   Smoking status: Never   Smokeless tobacco: Never  Vaping Use   Vaping status: Never Used  Substance Use Topics   Alcohol use: Yes    Comment: social   Drug use: No     Allergies   Kiwi extract, Oxycodone , and Tramadol    Review of Systems Review of Systems Per HPI  Physical Exam Triage Vital Signs ED Triage Vitals  Encounter Vitals Group     BP 11/09/24 1151 122/83     Girls Systolic BP Percentile --      Girls Diastolic BP Percentile --      Boys Systolic BP Percentile --      Boys Diastolic BP Percentile --      Pulse Rate 11/09/24 1151 68  Resp 11/09/24 1151 14     Temp 11/09/24 1151 98.6 F (37 C)     Temp Source 11/09/24 1151 Oral     SpO2 11/09/24 1151 97 %     Weight --      Height --      Head Circumference --      Peak Flow --      Pain Score 11/09/24 1150 10     Pain Loc --      Pain Education --      Exclude from Growth Chart --    No data found.  Updated Vital Signs BP 122/83 (BP Location: Left Arm)   Pulse 68   Temp 98.6 F (37 C) (Oral)   Resp 14   SpO2 97%   Visual Acuity Right Eye Distance:   Left Eye Distance:   Bilateral Distance:    Right Eye Near:   Left Eye Near:    Bilateral Near:     Physical Exam Vitals and nursing note reviewed.  Constitutional:      General: She is not in acute distress.    Appearance: Normal appearance. She is not toxic-appearing.  HENT:     Mouth/Throat:     Mouth: Mucous membranes are moist.     Pharynx: Oropharynx is clear.  Pulmonary:     Effort: Pulmonary effort is normal. No respiratory distress.  Musculoskeletal:     Comments: Inspection: no swelling, bruising, obvious deformity or redness to right shoulder Palpation: tender to palpation right shoulder diffusely; no obvious deformities palpated ROM: Difficult to assess secondary to pain Strength: difficult to assess secondary to pain Neurovascular: neurovascularly intact in distal bilateral upper extremities   Skin:     General: Skin is warm and dry.     Capillary Refill: Capillary refill takes less than 2 seconds.     Coloration: Skin is not jaundiced or pale.     Findings: No erythema.  Neurological:     Mental Status: She is alert and oriented to person, place, and time.  Psychiatric:        Behavior: Behavior is cooperative.      UC Treatments / Results  Labs (all labs ordered are listed, but only abnormal results are displayed) Labs Reviewed - No data to display  EKG   Radiology No results found.  Procedures Procedures (including critical care time)  Medications Ordered in UC Medications  methylPREDNISolone  sodium succinate (SOLU-MEDROL ) 125 mg/2 mL injection 80 mg (80 mg Intramuscular Given 11/09/24 1216)    Initial Impression / Assessment and Plan / UC Course  I have reviewed the triage vital signs and the nursing notes.  Pertinent labs & imaging results that were available during my care of the patient were reviewed by me and considered in my medical decision making (see chart for details).   Patient is a very pleasant, well appearing 43 year old African American female presenting today for right shoulder pain.  Vital signs are stable today and patient is non toxic, although appears to be in pain.  Given no recent injury, xray imaging deferred. Suspect inflammatory cause such as frozen shoulder or shoulder impingement.  Treat with Solu Medrol  injection today and start oral prednisone  tomorrow.  Avoid NSAIDs while on steroird medicine.  Recommended follow up with Orthopedist if symptoms do not improve significantly with treatment.  Work excuse provided.   The patient was given the opportunity to ask questions.  All questions answered to their satisfaction.  The patient is  in agreement to this plan.   Final Clinical Impressions(s) / UC Diagnoses   Final diagnoses:  Acute pain of right shoulder     Discharge Instructions      We gave you an injection of steroid medicine today.   Please start taking the oral prednisone  daily in the morning tomorrow to help with your shoulder pain.  If symptoms persist despite treatment, follow up with [facility] Monday.    ED Prescriptions     Medication Sig Dispense Auth. Provider   predniSONE  (DELTASONE ) 20 MG tablet Take 3 tablets (60mg ) on days 1-2. Take 2 tablets (40mg ) on days 3-4. Take 1 tablet (20mg ) on days 5-6, then stop. 12 tablet Chandra Harlene LABOR, NP      PDMP not reviewed this encounter.   Chandra Harlene LABOR, NP 11/09/24 1255

## 2024-11-09 NOTE — ED Triage Notes (Signed)
 Patient reports that she woke yesterday with right shoulder pain. Patient denies any injury. Patient states she is unable to raise her right arm.  Patient states she took Naproxen yesterday.

## 2024-11-09 NOTE — Discharge Instructions (Signed)
 We gave you an injection of steroid medicine today.  Please start taking the oral prednisone  daily in the morning tomorrow to help with your shoulder pain.  If symptoms persist despite treatment, follow up with [facility] Monday.
